# Patient Record
Sex: Female | Born: 1935 | Race: Black or African American | Hispanic: No | Smoking: Former smoker
Health system: Southern US, Community
[De-identification: ages and names within clinical notes are randomized; demographics above are authoritative.]

## PROBLEM LIST (undated history)

## (undated) DIAGNOSIS — E785 Hyperlipidemia, unspecified: Secondary | ICD-10-CM

## (undated) DIAGNOSIS — E669 Obesity, unspecified: Secondary | ICD-10-CM

## (undated) DIAGNOSIS — K219 Gastro-esophageal reflux disease without esophagitis: Secondary | ICD-10-CM

## (undated) DIAGNOSIS — E559 Vitamin D deficiency, unspecified: Secondary | ICD-10-CM

## (undated) DIAGNOSIS — N183 Chronic kidney disease, stage 3 unspecified: Secondary | ICD-10-CM

## (undated) DIAGNOSIS — L309 Dermatitis, unspecified: Secondary | ICD-10-CM

## (undated) DIAGNOSIS — E119 Type 2 diabetes mellitus without complications: Secondary | ICD-10-CM

## (undated) DIAGNOSIS — I10 Essential (primary) hypertension: Secondary | ICD-10-CM

## (undated) DIAGNOSIS — M1990 Unspecified osteoarthritis, unspecified site: Secondary | ICD-10-CM

## (undated) DIAGNOSIS — T7840XA Allergy, unspecified, initial encounter: Secondary | ICD-10-CM

## (undated) HISTORY — DX: Chronic kidney disease, stage 3 unspecified: N18.30

## (undated) HISTORY — DX: Hyperlipidemia, unspecified: E78.5

## (undated) HISTORY — DX: Gastro-esophageal reflux disease without esophagitis: K21.9

## (undated) HISTORY — DX: Dermatitis, unspecified: L30.9

## (undated) HISTORY — DX: Obesity, unspecified: E66.9

## (undated) HISTORY — DX: Chronic kidney disease, stage 3 (moderate): N18.3

## (undated) HISTORY — DX: Allergy, unspecified, initial encounter: T78.40XA

## (undated) HISTORY — DX: Unspecified osteoarthritis, unspecified site: M19.90

## (undated) HISTORY — DX: Vitamin D deficiency, unspecified: E55.9

## (undated) HISTORY — DX: Essential (primary) hypertension: I10

---

## 2007-04-28 DIAGNOSIS — K219 Gastro-esophageal reflux disease without esophagitis: Secondary | ICD-10-CM | POA: Insufficient documentation

## 2008-09-11 ENCOUNTER — Emergency Department: Payer: Self-pay | Admitting: Emergency Medicine

## 2008-09-15 ENCOUNTER — Emergency Department: Payer: Self-pay | Admitting: Emergency Medicine

## 2011-12-31 DIAGNOSIS — E1129 Type 2 diabetes mellitus with other diabetic kidney complication: Secondary | ICD-10-CM | POA: Diagnosis not present

## 2011-12-31 DIAGNOSIS — E785 Hyperlipidemia, unspecified: Secondary | ICD-10-CM | POA: Diagnosis not present

## 2011-12-31 DIAGNOSIS — E559 Vitamin D deficiency, unspecified: Secondary | ICD-10-CM | POA: Diagnosis not present

## 2011-12-31 DIAGNOSIS — N183 Chronic kidney disease, stage 3 unspecified: Secondary | ICD-10-CM | POA: Diagnosis not present

## 2011-12-31 DIAGNOSIS — I10 Essential (primary) hypertension: Secondary | ICD-10-CM | POA: Diagnosis not present

## 2012-05-01 DIAGNOSIS — I10 Essential (primary) hypertension: Secondary | ICD-10-CM | POA: Diagnosis not present

## 2012-05-01 DIAGNOSIS — E1129 Type 2 diabetes mellitus with other diabetic kidney complication: Secondary | ICD-10-CM | POA: Diagnosis not present

## 2012-05-01 DIAGNOSIS — E785 Hyperlipidemia, unspecified: Secondary | ICD-10-CM | POA: Diagnosis not present

## 2012-09-02 DIAGNOSIS — E1129 Type 2 diabetes mellitus with other diabetic kidney complication: Secondary | ICD-10-CM | POA: Diagnosis not present

## 2012-09-02 DIAGNOSIS — I10 Essential (primary) hypertension: Secondary | ICD-10-CM | POA: Diagnosis not present

## 2012-09-02 DIAGNOSIS — E785 Hyperlipidemia, unspecified: Secondary | ICD-10-CM | POA: Diagnosis not present

## 2012-09-02 DIAGNOSIS — Z23 Encounter for immunization: Secondary | ICD-10-CM | POA: Diagnosis not present

## 2013-01-02 DIAGNOSIS — E785 Hyperlipidemia, unspecified: Secondary | ICD-10-CM | POA: Diagnosis not present

## 2013-01-02 DIAGNOSIS — I10 Essential (primary) hypertension: Secondary | ICD-10-CM | POA: Diagnosis not present

## 2013-01-02 DIAGNOSIS — E1129 Type 2 diabetes mellitus with other diabetic kidney complication: Secondary | ICD-10-CM | POA: Diagnosis not present

## 2013-05-04 DIAGNOSIS — E785 Hyperlipidemia, unspecified: Secondary | ICD-10-CM | POA: Diagnosis not present

## 2013-05-04 DIAGNOSIS — I10 Essential (primary) hypertension: Secondary | ICD-10-CM | POA: Diagnosis not present

## 2013-05-04 DIAGNOSIS — E1129 Type 2 diabetes mellitus with other diabetic kidney complication: Secondary | ICD-10-CM | POA: Diagnosis not present

## 2013-09-07 DIAGNOSIS — E1129 Type 2 diabetes mellitus with other diabetic kidney complication: Secondary | ICD-10-CM | POA: Diagnosis not present

## 2013-09-07 DIAGNOSIS — Z23 Encounter for immunization: Secondary | ICD-10-CM | POA: Diagnosis not present

## 2013-09-07 DIAGNOSIS — E785 Hyperlipidemia, unspecified: Secondary | ICD-10-CM | POA: Diagnosis not present

## 2013-10-15 ENCOUNTER — Emergency Department: Payer: Self-pay | Admitting: Emergency Medicine

## 2013-10-15 DIAGNOSIS — E119 Type 2 diabetes mellitus without complications: Secondary | ICD-10-CM | POA: Diagnosis not present

## 2013-10-15 DIAGNOSIS — R259 Unspecified abnormal involuntary movements: Secondary | ICD-10-CM | POA: Diagnosis not present

## 2013-10-15 DIAGNOSIS — I10 Essential (primary) hypertension: Secondary | ICD-10-CM | POA: Diagnosis not present

## 2013-10-15 DIAGNOSIS — R42 Dizziness and giddiness: Secondary | ICD-10-CM | POA: Diagnosis not present

## 2013-10-15 DIAGNOSIS — Z0000 Encounter for general adult medical examination without abnormal findings: Secondary | ICD-10-CM | POA: Diagnosis not present

## 2013-10-15 DIAGNOSIS — Z79899 Other long term (current) drug therapy: Secondary | ICD-10-CM | POA: Diagnosis not present

## 2013-10-15 DIAGNOSIS — R51 Headache: Secondary | ICD-10-CM | POA: Diagnosis not present

## 2013-10-15 LAB — CBC WITH DIFFERENTIAL/PLATELET
Basophil #: 0 10*3/uL (ref 0.0–0.1)
Basophil %: 1 %
Eosinophil %: 3.2 %
HCT: 34.9 % — ABNORMAL LOW (ref 35.0–47.0)
Lymphocyte %: 26.8 %
MCH: 27.9 pg (ref 26.0–34.0)
MCHC: 33.2 g/dL (ref 32.0–36.0)
Monocyte #: 0.4 x10 3/mm (ref 0.2–0.9)
Monocyte %: 10.3 %
Platelet: 272 10*3/uL (ref 150–440)
RBC: 4.16 10*6/uL (ref 3.80–5.20)
WBC: 4.1 10*3/uL (ref 3.6–11.0)

## 2013-10-15 LAB — BASIC METABOLIC PANEL
Chloride: 99 mmol/L (ref 98–107)
Co2: 29 mmol/L (ref 21–32)
EGFR (African American): 48 — ABNORMAL LOW
Potassium: 3.5 mmol/L (ref 3.5–5.1)
Sodium: 134 mmol/L — ABNORMAL LOW (ref 136–145)

## 2013-10-16 LAB — URINALYSIS, COMPLETE
Blood: NEGATIVE
Glucose,UR: NEGATIVE mg/dL (ref 0–75)
Ketone: NEGATIVE
Leukocyte Esterase: NEGATIVE
Nitrite: NEGATIVE
Specific Gravity: 1.004 (ref 1.003–1.030)
Squamous Epithelial: 1

## 2013-11-17 DIAGNOSIS — E1129 Type 2 diabetes mellitus with other diabetic kidney complication: Secondary | ICD-10-CM | POA: Diagnosis not present

## 2013-12-24 DIAGNOSIS — I10 Essential (primary) hypertension: Secondary | ICD-10-CM | POA: Diagnosis not present

## 2013-12-24 DIAGNOSIS — N183 Chronic kidney disease, stage 3 unspecified: Secondary | ICD-10-CM | POA: Diagnosis not present

## 2013-12-24 DIAGNOSIS — E1129 Type 2 diabetes mellitus with other diabetic kidney complication: Secondary | ICD-10-CM | POA: Diagnosis not present

## 2014-01-08 DIAGNOSIS — E1129 Type 2 diabetes mellitus with other diabetic kidney complication: Secondary | ICD-10-CM | POA: Diagnosis not present

## 2014-01-08 DIAGNOSIS — N183 Chronic kidney disease, stage 3 unspecified: Secondary | ICD-10-CM | POA: Diagnosis not present

## 2014-01-08 DIAGNOSIS — E785 Hyperlipidemia, unspecified: Secondary | ICD-10-CM | POA: Diagnosis not present

## 2014-01-08 DIAGNOSIS — I10 Essential (primary) hypertension: Secondary | ICD-10-CM | POA: Diagnosis not present

## 2014-05-24 DIAGNOSIS — E1129 Type 2 diabetes mellitus with other diabetic kidney complication: Secondary | ICD-10-CM | POA: Diagnosis not present

## 2014-05-24 DIAGNOSIS — N183 Chronic kidney disease, stage 3 unspecified: Secondary | ICD-10-CM | POA: Diagnosis not present

## 2014-05-24 DIAGNOSIS — I10 Essential (primary) hypertension: Secondary | ICD-10-CM | POA: Diagnosis not present

## 2014-05-24 DIAGNOSIS — E785 Hyperlipidemia, unspecified: Secondary | ICD-10-CM | POA: Diagnosis not present

## 2014-08-26 DIAGNOSIS — Z23 Encounter for immunization: Secondary | ICD-10-CM | POA: Diagnosis not present

## 2014-09-13 DIAGNOSIS — H25013 Cortical age-related cataract, bilateral: Secondary | ICD-10-CM | POA: Diagnosis not present

## 2014-09-20 DIAGNOSIS — H2512 Age-related nuclear cataract, left eye: Secondary | ICD-10-CM | POA: Diagnosis not present

## 2014-11-03 DIAGNOSIS — H2512 Age-related nuclear cataract, left eye: Secondary | ICD-10-CM | POA: Diagnosis not present

## 2014-11-04 ENCOUNTER — Ambulatory Visit: Payer: Self-pay | Admitting: Ophthalmology

## 2014-11-04 DIAGNOSIS — Z01810 Encounter for preprocedural cardiovascular examination: Secondary | ICD-10-CM | POA: Diagnosis not present

## 2014-11-04 DIAGNOSIS — H2512 Age-related nuclear cataract, left eye: Secondary | ICD-10-CM | POA: Diagnosis not present

## 2014-11-04 DIAGNOSIS — Z01812 Encounter for preprocedural laboratory examination: Secondary | ICD-10-CM | POA: Diagnosis not present

## 2014-11-04 DIAGNOSIS — I10 Essential (primary) hypertension: Secondary | ICD-10-CM | POA: Diagnosis not present

## 2014-11-04 LAB — POTASSIUM: Potassium: 4 mmol/L (ref 3.5–5.1)

## 2014-11-16 ENCOUNTER — Ambulatory Visit: Payer: Self-pay | Admitting: Ophthalmology

## 2014-11-16 DIAGNOSIS — H2512 Age-related nuclear cataract, left eye: Secondary | ICD-10-CM | POA: Diagnosis not present

## 2014-11-16 DIAGNOSIS — E119 Type 2 diabetes mellitus without complications: Secondary | ICD-10-CM | POA: Diagnosis not present

## 2014-11-16 DIAGNOSIS — E669 Obesity, unspecified: Secondary | ICD-10-CM | POA: Diagnosis not present

## 2014-11-16 DIAGNOSIS — M1990 Unspecified osteoarthritis, unspecified site: Secondary | ICD-10-CM | POA: Diagnosis not present

## 2014-11-16 DIAGNOSIS — K219 Gastro-esophageal reflux disease without esophagitis: Secondary | ICD-10-CM | POA: Diagnosis not present

## 2014-11-16 DIAGNOSIS — I10 Essential (primary) hypertension: Secondary | ICD-10-CM | POA: Diagnosis not present

## 2014-11-16 HISTORY — PX: CATARACT EXTRACTION: SUR2

## 2014-11-23 DIAGNOSIS — I10 Essential (primary) hypertension: Secondary | ICD-10-CM | POA: Diagnosis not present

## 2014-11-23 DIAGNOSIS — E1122 Type 2 diabetes mellitus with diabetic chronic kidney disease: Secondary | ICD-10-CM | POA: Diagnosis not present

## 2014-11-23 DIAGNOSIS — E785 Hyperlipidemia, unspecified: Secondary | ICD-10-CM | POA: Diagnosis not present

## 2014-11-23 DIAGNOSIS — N183 Chronic kidney disease, stage 3 (moderate): Secondary | ICD-10-CM | POA: Diagnosis not present

## 2014-11-23 LAB — LIPID PANEL
Cholesterol: 196 mg/dL (ref 0–200)
HDL: 68 mg/dL (ref 35–70)
LDL CALC: 110 mg/dL
TRIGLYCERIDES: 90 mg/dL (ref 40–160)

## 2014-11-23 LAB — HEMOGLOBIN A1C: HEMOGLOBIN A1C: 6.8 % — AB (ref 4.0–6.0)

## 2014-12-24 DIAGNOSIS — Z961 Presence of intraocular lens: Secondary | ICD-10-CM | POA: Diagnosis not present

## 2015-03-19 NOTE — Op Note (Signed)
PATIENT NAME:  Olivia Chambers, Olivia MR#:  [id number] DATE OF BIRTH:  1928/11/17  DATE OF PROCEDURE:  11/16/2014  PREOPERATIVE DIAGNOSIS:  Nuclear sclerotic cataract of the left eye.   POSTOPERATIVE DIAGNOSIS:  Nuclear sclerotic cataract of the left eye.   OPERATIVE PROCEDURE:  Cataract extraction by phacoemulsification with implant of intraocular lens to left eye.   SURGEON:  Jerilee FieldWilliam L. Kinza Gouveia, MD  ANESTHESIA:  1. Managed anesthesia care.  2. Topical tetracaine drops followed by 2% Xylocaine jelly applied in the preoperative holding area.   COMPLICATIONS:  None.   TECHNIQUE:   Stop and chop.    DESCRIPTION OF PROCEDURE:  The patient was examined and consented in the preoperative holding area where the aforementioned topical anesthesia was applied to the left eye and then brought back to the Operating Room where the left eye was prepped and draped in the usual sterile ophthalmic fashion and a lid speculum was placed. A paracentesis was created with the side port blade and the anterior chamber was filled with viscoelastic. A near clear corneal incision was performed with the steel keratome. A continuous curvilinear capsulorrhexis was performed with a cystotome followed by the capsulorrhexis forceps. Hydrodissection and hydrodelineation were carried out with BSS on a blunt cannula. The lens was removed in a stop and chop technique and the remaining cortical material was removed with the irrigation-aspiration handpiece. The capsular bag was inflated with viscoelastic and the Tecnis ZCB00 22.5-diopter lens, serial number [id number], was placed in the capsular bag without complication. The remaining viscoelastic was removed from the eye with the irrigation-aspiration handpiece. The wounds were hydrated. The anterior chamber was flushed with Miostat and the eye was inflated to physiologic pressure. 0.1 mL of cefuroxime concentration 10 mg/mL was placed in the anterior chamber. The wounds were found to be  water tight. The eye was dressed with Vigamox. The patient was given protective glasses to wear throughout the day and a shield with which to sleep tonight. The patient was also given drops with which to begin a drop regimen today and will follow-up with me in one day.     ____________________________ Jerilee FieldWilliam L. Jasson Siegmann, MD wlp:nb D: 11/16/2014 21:54:30 ET T: 11/16/2014 22:07:04 ET JOB#: [id number]  cc: Page Pucciarelli L. Milburn Freeney, MD, <Dictator> Jerilee FieldWILLIAM L Corey Laski MD ELECTRONICALLY SIGNED 11/17/2014 14:41

## 2015-05-22 ENCOUNTER — Encounter: Payer: Self-pay | Admitting: Family Medicine

## 2015-05-22 DIAGNOSIS — IMO0002 Reserved for concepts with insufficient information to code with codable children: Secondary | ICD-10-CM | POA: Insufficient documentation

## 2015-05-22 DIAGNOSIS — E1129 Type 2 diabetes mellitus with other diabetic kidney complication: Secondary | ICD-10-CM | POA: Insufficient documentation

## 2015-05-22 DIAGNOSIS — E1169 Type 2 diabetes mellitus with other specified complication: Secondary | ICD-10-CM

## 2015-05-22 DIAGNOSIS — N183 Chronic kidney disease, stage 3 unspecified: Secondary | ICD-10-CM | POA: Insufficient documentation

## 2015-05-22 DIAGNOSIS — E559 Vitamin D deficiency, unspecified: Secondary | ICD-10-CM | POA: Insufficient documentation

## 2015-05-22 DIAGNOSIS — I10 Essential (primary) hypertension: Secondary | ICD-10-CM | POA: Insufficient documentation

## 2015-05-22 DIAGNOSIS — J309 Allergic rhinitis, unspecified: Secondary | ICD-10-CM | POA: Insufficient documentation

## 2015-05-22 DIAGNOSIS — E785 Hyperlipidemia, unspecified: Secondary | ICD-10-CM | POA: Insufficient documentation

## 2015-05-22 DIAGNOSIS — M1710 Unilateral primary osteoarthritis, unspecified knee: Secondary | ICD-10-CM | POA: Insufficient documentation

## 2015-05-22 DIAGNOSIS — E1159 Type 2 diabetes mellitus with other circulatory complications: Secondary | ICD-10-CM

## 2015-05-22 DIAGNOSIS — E669 Obesity, unspecified: Secondary | ICD-10-CM | POA: Insufficient documentation

## 2015-05-24 ENCOUNTER — Encounter: Payer: Self-pay | Admitting: Family Medicine

## 2015-05-24 ENCOUNTER — Ambulatory Visit (INDEPENDENT_AMBULATORY_CARE_PROVIDER_SITE_OTHER): Payer: Commercial Managed Care - HMO | Admitting: Family Medicine

## 2015-05-24 VITALS — BP 118/66 | HR 89 | Temp 97.7°F | Resp 16 | Ht 64.0 in | Wt 227.6 lb

## 2015-05-24 DIAGNOSIS — N183 Chronic kidney disease, stage 3 unspecified: Secondary | ICD-10-CM

## 2015-05-24 DIAGNOSIS — R011 Cardiac murmur, unspecified: Secondary | ICD-10-CM | POA: Diagnosis not present

## 2015-05-24 DIAGNOSIS — E785 Hyperlipidemia, unspecified: Secondary | ICD-10-CM

## 2015-05-24 DIAGNOSIS — E1122 Type 2 diabetes mellitus with diabetic chronic kidney disease: Secondary | ICD-10-CM

## 2015-05-24 DIAGNOSIS — E1322 Other specified diabetes mellitus with diabetic chronic kidney disease: Secondary | ICD-10-CM

## 2015-05-24 DIAGNOSIS — I10 Essential (primary) hypertension: Secondary | ICD-10-CM

## 2015-05-24 DIAGNOSIS — E669 Obesity, unspecified: Secondary | ICD-10-CM

## 2015-05-24 LAB — POCT UA - MICROALBUMIN: MICROALBUMIN (UR) POC: NEGATIVE mg/L

## 2015-05-24 LAB — POCT GLYCOSYLATED HEMOGLOBIN (HGB A1C): Hemoglobin A1C: 7.3

## 2015-05-24 MED ORDER — ROSUVASTATIN CALCIUM 40 MG PO TABS: 40.0000 mg | ORAL_TABLET | Freq: Every day | ORAL | Status: DC

## 2015-05-24 MED ORDER — LISINOPRIL 40 MG PO TABS: 40.0000 mg | ORAL_TABLET | Freq: Every evening | ORAL | Status: DC

## 2015-05-24 MED ORDER — GLIPIZIDE ER 2.5 MG PO TB24: 2.5000 mg | ORAL_TABLET | Freq: Every day | ORAL | Status: DC

## 2015-05-24 MED ORDER — GLUCOSE BLOOD VI STRP: 1.0000 | ORAL_STRIP | Freq: Two times a day (BID) | Status: DC

## 2015-05-24 MED ORDER — METFORMIN HCL 500 MG PO TABS: 500.0000 mg | ORAL_TABLET | Freq: Two times a day (BID) | ORAL | Status: DC

## 2015-05-24 MED ORDER — AMLODIPINE BESYLATE 5 MG PO TABS: 5.0000 mg | ORAL_TABLET | Freq: Every day | ORAL | Status: DC

## 2015-05-24 MED ORDER — HYDROCHLOROTHIAZIDE 25 MG PO TABS: 25.0000 mg | ORAL_TABLET | Freq: Every day | ORAL | Status: DC

## 2015-05-24 NOTE — Patient Instructions (Signed)
Heart Murmur A heart murmur is an extra sound heard by your health care provider when listening to your heart with a device called a stethoscope. The sound comes from turbulence when blood flows through the heart and may be a "hum" or "whoosh" sound heard when the heart beats. There are two types of heart murmurs:  Innocent murmurs. Most people with this type of heart murmur do not have a heart problem. Many children have innocent heart murmurs. Your health care provider may suggest some basic testing to know whether your murmur is an innocent murmur. If an innocent heart murmur is found, there is no need for further tests or treatment and no need to restrict activities or stop playing sports.  Abnormal murmurs. These types of murmurs can occur in children and adults. In children, abnormal heart murmurs are typically caused from heart defects that are present at birth (congenital). In adults, abnormal murmurs are usually from heart valve problems caused by disease, infection, or aging. CAUSES  All heart murmurs are a result of an issue with your heart valves. Normally, these valves open to let blood flow through or out of your heart and then shut to keep it from flowing backward. If they do not work properly, you could have:  Regurgitation--When blood leaks back through the valve in the wrong direction.  Mitral valve prolapse--When the mitral valve of the heart has a loose flap and does not close tightly.  Stenosis--When the valve does not open enough and blocks blood flow. SIGNS AND SYMPTOMS  Innocent murmurs do not cause symptoms, and many people with abnormal murmurs may or may not have symptoms. If symptoms do develop, they may include:  Shortness of breath.  Blue coloring of the skin, especially on the fingertips.  Chest pain.  Palpitations, or feeling a fluttering or skipped heartbeat.  Fainting.  Persistent cough.  Getting tired much faster than expected. DIAGNOSIS  A heart  murmur might be heard during a sports physical or during any type of examination. When a murmur is heard, it may suggest a possible problem. When this happens, your health care provider may ask you to see a heart specialist (cardiologist). You may also be asked to have one or more heart tests. In these cases, testing may vary depending on what your health care provider heard. Tests for a heart murmur may include:  Electrocardiogram.  Echocardiogram.  MRI. For children and adults who have an abnormal heart murmur and want to play sports, it is important to complete testing, review test results, and receive recommendations from your health care provider. If heart disease is present, it may not be safe to play. TREATMENT  Innocent murmurs require no treatment or activity restriction. If an abnormal murmur represents a problem with the heart, treatment will depend on the exact nature of the problem. In these cases, medicine or surgery may be needed to treat the problem. HOME CARE INSTRUCTIONS If you want to participate in sports or other types of strenuous physical activity, it is important to discuss this first with your health care provider. If the murmur represents a problem with the heart and you choose to participate in sports, there is a small chance that a serious problem (including sudden death) could result.  SEEK MEDICAL CARE IF:   You feel that your symptoms are slowly worsening.  You develop any new symptoms that cause concern.  You feel that you are having side effects from any medicines prescribed. SEEK IMMEDIATE MEDICAL CARE IF:     You develop chest pain.  You have shortness of breath.  You notice that your heart beats irregularly often enough to cause you to worry.  You have fainting spells.  Your symptoms suddenly get worse. Document Released: 12/20/2004 Document Revised: 11/17/2013 Document Reviewed: 07/20/2013 Exit[employer] Patient Information 2015 [employer]. This  information is not intended to replace advice given to you by your health care provider. Make sure you discuss any questions you have with your health care provider.  

## 2015-05-24 NOTE — Progress Notes (Signed)
Name: Olivia RubensteinObelia L Chambers   MRN: [id number]    DOB: 12/18/1927   Date:05/24/2015       Progress Note  Subjective  Chief Complaint  Chief Complaint  Patient presents with  . Medication Refill    6 month F/U  . Hypertension  . Diabetes    Checks BS 2x daily Low-56 Average-98 High-200  . Hyperlipidemia  . Chronic Kidney Disease    HPI  DMII with renal manifestation:  Olivia Chambers is a 81 year old pleasant female, that came in today for follow up.  She checks her fsbs twice daily and she had one episode of hypoglycemia about 3 months, because she skipped a meal. She is compliant with her medications, hgbA1C is up to 7.3% , but because of her age that is still considered at goal.  She has CKI stage 3 and is taking lisinopril daily.   HTN: bp is towards the low end of normal, she denies orthostatic changes, dizziness or syncope, no chest pain or palpitation.  She is compliant with her medications  Hyperlipidemia: taking Crestor daily and LDL was 100 and HDL 68 in Dec 2015, denies side effects of medications  Obesity: She has gained a couple of pounds since last visit, states not as compliant with her diet lately, but will resume it now.   Patient Active Problem List   Diagnosis Date Noted  . Benign essential HTN 05/22/2015  . Chronic kidney disease (CKD), stage III (moderate) 05/22/2015  . Dyslipidemia 05/22/2015  . Obesity (BMI 30-39.9) 05/22/2015  . Osteoarthrosis, unspecified whether generalized or localized, involving lower leg 05/22/2015  . Allergic rhinitis 05/22/2015  . Diabetes mellitus with renal manifestation 05/22/2015  . Vitamin D deficiency 05/22/2015  . Gastro-esophageal reflux disease without esophagitis 04/28/2007    Past Surgical History  Procedure Laterality Date  . Cataract extraction Left 11/16/2014    Right- 12-04-2014    Family History  Problem Relation Age of Onset  . Hypertension Mother   . Diabetes Mother   . Cancer Father     Prostate  . Hypertension  Father   . Diabetes Sister   . Dementia Sister   . Diabetes Brother     History   Social History  . Marital Status: Widowed    Spouse Name: N/A  . Number of Children: N/A  . Years of Education: N/A   Occupational History  . Not on file.   Social History Main Topics  . Smoking status: Former Smoker -- 0.50 packs/day    Types: Cigarettes    Quit date: 11/26/2002  . Smokeless tobacco: Never Used  . Alcohol Use: No  . Drug Use: No  . Sexual Activity: No   Other Topics Concern  . Not on file   Social History Narrative     Current outpatient prescriptions:  .  ACCU-CHEK FASTCLIX LANCETS MISC, , Disp: , Rfl:  .  Alcohol Swabs (B-D SINGLE USE SWABS REGULAR) PADS, , Disp: , Rfl:  .  amLODipine (NORVASC) 5 MG tablet, Take 1 tablet (5 mg total) by mouth daily., Disp: 90 tablet, Rfl: 1 .  aspirin 81 MG chewable tablet, Chew 1 tablet by mouth daily., Disp: , Rfl:  .  Cholecalciferol (VITAMIN D) 2000 UNITS tablet, Take 1 tablet by mouth daily., Disp: , Rfl:  .  glipiZIDE (GLUCOTROL XL) 2.5 MG 24 hr tablet, Take 1 tablet (2.5 mg total) by mouth daily., Disp: 90 tablet, Rfl: 1 .  glucose blood (ACCU-CHEK COMPACT PLUS) test strip,  1 each by Other route 2 (two) times daily. Use as instructed, Disp: 100 each, Rfl: 1 .  hydrochlorothiazide (HYDRODIURIL) 25 MG tablet, Take 1 tablet (25 mg total) by mouth daily., Disp: 90 tablet, Rfl: 3 .  lisinopril (PRINIVIL,ZESTRIL) 40 MG tablet, Take 1 tablet (40 mg total) by mouth every evening., Disp: 90 tablet, Rfl: 3 .  metFORMIN (GLUCOPHAGE) 500 MG tablet, Take 1 tablet (500 mg total) by mouth 2 (two) times daily., Disp: 180 tablet, Rfl: 3 .  rosuvastatin (CRESTOR) 40 MG tablet, Take 1 tablet (40 mg total) by mouth daily., Disp: 90 tablet, Rfl: 3  Allergies  Allergen Reactions  . Banana Anaphylaxis  . Pioglitazone     pt states causes headaches  . Sitagliptin Other (See Comments)  . Shrimp [Shellfish Allergy] Hives and Rash      ROS  Constitutional: Negative for fever or significant weight change.  Respiratory: Negative for cough and shortness of breath.   Cardiovascular: Negative for chest pain or palpitations.  Gastrointestinal: Negative for abdominal pain, no bowel changes.  Musculoskeletal: Negative for gait problem or joint swelling.  Skin: Negative for rash.  Neurological: Negative for dizziness or headache.  No other specific complaints in a complete review of systems (except as listed in HPI above).   Objective  Filed Vitals:   05/24/15 0820  BP: 118/66  Pulse: 89  Temp: 97.7 F (36.5 C)  TempSrc: Oral  Resp: 16  Height: 5\' 4"  (1.626 m)  Weight: 227 lb 9.6 oz (103.239 kg)  SpO2: 98%    Body mass index is 39.05 kg/(m^2).  Physical Exam Constitutional: Patient appears well-developed and well-nourished. No distress.  HENT: Head: Normocephalic and atraumatic. Nose: Nose normal. Mouth/Throat: Oropharynx is clear and moist. No oropharyngeal exudate.  Eyes: Conjunctivae and EOM are normal. Pupils are equal, round, and reactive to light. No scleral icterus.  Neck: Normal range of motion. Neck supple. No JVD present. No thyromegaly present.  Cardiovascular: Normal rate, regular rhythm and normal heart sounds. Heart murmur on 2nd intercostal space right border 2/6 , with an opening click. trace pretibial edema Pulmonary/Chest: Effort normal and breath sounds normal. No respiratory distress. Abdominal: Soft. Bowel sounds are normal, no distension. There is no tenderness. no masses Musculoskeletal: Normal range of motion, no joint effusions. No gross deformities Neurological: he is alert and oriented to person, place, and time. No cranial nerve deficit. Coordination, balance, strength, speech and gait are normal.  Skin: Skin is warm and dry. No rash noted. No erythema.  Psychiatric: Patient has a normal mood and affect. behavior is normal. Judgment and thought content normal.  Recent Results  (from the past 2160 hour(s))  POCT UA - Microalbumin     Status: None   Collection Time: 05/24/15  8:18 AM  Result Value Ref Range   Microalbumin Ur, POC negative mg/L   Creatinine, POC  mg/dL   Albumin/Creatinine Ratio, Urine, POC    POCT HgB A1C     Status: Abnormal   Collection Time: 05/24/15  8:29 AM  Result Value Ref Range   Hemoglobin A1C 7.3     Diabetic Foot Exam: Diabetic Foot Exam - Simple   Simple Foot Form  Visual Inspection  See comments:  Yes  Sensation Testing  Intact to touch and monofilament testing bilaterally:  Yes  Pulse Check  Posterior Tibialis and Dorsalis pulse intact bilaterally:  Yes  Comments  Corn formation , thick nails and hammer toes       PHQ2/9: Depression  screen PHQ 2/9 05/24/2015  Decreased Interest 0  Down, Depressed, Hopeless 0  PHQ - 2 Score 0     Fall Risk: Fall Risk  05/24/2015  Falls in the past year? No    Assessment & Plan  1. Diabetes mellitus with stage 3 chronic kidney disease She does not want to see a podiatrist at this time, urine micro normal  - POCT HgB A1C - POCT UA - Microalbumin - metFORMIN (GLUCOPHAGE) 500 MG tablet; Take 1 tablet (500 mg total) by mouth 2 (two) times daily.  Dispense: 180 tablet; Refill: 3 - glipiZIDE (GLUCOTROL XL) 2.5 MG 24 hr tablet; Take 1 tablet (2.5 mg total) by mouth daily.  Dispense: 90 tablet; Refill: 1 - glucose blood (ACCU-CHEK COMPACT PLUS) test strip; 1 each by Other route 2 (two) times daily. Use as instructed  Dispense: 100 each; Refill: 1  2. Dyslipidemia Continue medication - rosuvastatin (CRESTOR) 40 MG tablet; Take 1 tablet (40 mg total) by mouth daily.  Dispense: 90 tablet; Refill: 3  3. Chronic kidney disease (CKD), stage III (moderate) We will decrease lisinopril to once daily since bp is towards low end of normal  - lisinopril (PRINIVIL,ZESTRIL) 40 MG tablet; Take 1 tablet (40 mg total) by mouth every evening.  Dispense: 90 tablet; Refill: 3  4. Benign essential  HTN Adjust medication, we may need to decrease norvasc on her next visit, she was hesitant of too many changes today, decreased dose of lisinopril to once daily  - hydrochlorothiazide (HYDRODIURIL) 25 MG tablet; Take 1 tablet (25 mg total) by mouth daily.  Dispense: 90 tablet; Refill: 3 - lisinopril (PRINIVIL,ZESTRIL) 40 MG tablet; Take 1 tablet (40 mg total) by mouth every evening.  Dispense: 90 tablet; Refill: 3 - amLODipine (NORVASC) 5 MG tablet; Take 1 tablet (5 mg total) by mouth daily.  Dispense: 90 tablet; Refill: 1  5. Obesity (BMI 30-39.9) Discussed importance of exercise   6. Heart murmur, systolic Offered to order an Echo but she wants to hold off, explained symptoms  : chest pain, palpitation, SOB, syncope to call us back.

## 2015-07-04 DIAGNOSIS — E11319 Type 2 diabetes mellitus with unspecified diabetic retinopathy without macular edema: Secondary | ICD-10-CM | POA: Diagnosis not present

## 2015-07-05 ENCOUNTER — Encounter: Payer: Self-pay | Admitting: Family Medicine

## 2015-07-05 DIAGNOSIS — H3500 Unspecified background retinopathy: Secondary | ICD-10-CM | POA: Insufficient documentation

## 2015-08-15 ENCOUNTER — Encounter: Payer: Self-pay | Admitting: Family Medicine

## 2015-08-15 ENCOUNTER — Ambulatory Visit (INDEPENDENT_AMBULATORY_CARE_PROVIDER_SITE_OTHER): Payer: Commercial Managed Care - HMO | Admitting: Family Medicine

## 2015-08-15 VITALS — BP 124/64 | HR 95 | Temp 98.0°F | Resp 18 | Ht 64.0 in | Wt 228.2 lb

## 2015-08-15 DIAGNOSIS — N183 Chronic kidney disease, stage 3 unspecified: Secondary | ICD-10-CM

## 2015-08-15 DIAGNOSIS — I10 Essential (primary) hypertension: Secondary | ICD-10-CM | POA: Diagnosis not present

## 2015-08-15 DIAGNOSIS — Z23 Encounter for immunization: Secondary | ICD-10-CM | POA: Diagnosis not present

## 2015-08-15 NOTE — Progress Notes (Signed)
Name: Olivia Chambers   MRN: [id number]    DOB: October 08, 1928   Date:08/15/2015       Progress Note  Subjective  Chief Complaint  Chief Complaint  Patient presents with  . Hypertension    Patient states blood pressure keeps increasing and decreasing.  She is concerned    HPI  HTN: she came in today for medication review, she states her blood pressure is usually at goal . She states she has been taking all of her bp medications in am, and sometimes in the afternoon bp is going up to 146/70's and she has been concerned about it. She denies chest pain, SOB or palpitation.     Patient Active Problem List   Diagnosis Date Noted  . Retinopathy, background, nonproliferative, mild 07/05/2015  . Benign essential HTN 05/22/2015  . Chronic kidney disease (CKD), stage III (moderate) 05/22/2015  . Dyslipidemia 05/22/2015  . Obesity (BMI 30-39.9) 05/22/2015  . Osteoarthrosis, unspecified whether generalized or localized, involving lower leg 05/22/2015  . Allergic rhinitis 05/22/2015  . Diabetes mellitus with renal manifestation 05/22/2015  . Vitamin D deficiency 05/22/2015  . Gastro-esophageal reflux disease without esophagitis 04/28/2007    Past Surgical History  Procedure Laterality Date  . Cataract extraction Left 11/16/2014    Right- 12-04-2014    Family History  Problem Relation Age of Onset  . Hypertension Mother   . Diabetes Mother   . Cancer Father     Prostate  . Hypertension Father   . Diabetes Sister   . Dementia Sister   . Diabetes Brother     Social History   Social History  . Marital Status: Widowed    Spouse Name: N/A  . Number of Children: N/A  . Years of Education: N/A   Occupational History  . Not on file.   Social History Main Topics  . Smoking status: Former Smoker -- 0.50 packs/day    Types: Cigarettes    Quit date: 11/26/2002  . Smokeless tobacco: Never Used  . Alcohol Use: No  . Drug Use: No  . Sexual Activity: No   Other Topics Concern  . Not  on file   Social History Narrative     Current outpatient prescriptions:  .  ACCU-CHEK FASTCLIX LANCETS MISC, , Disp: , Rfl:  .  Alcohol Swabs (B-D SINGLE USE SWABS REGULAR) PADS, , Disp: , Rfl:  .  amLODipine (NORVASC) 5 MG tablet, Take 1 tablet (5 mg total) by mouth daily., Disp: 90 tablet, Rfl: 1 .  aspirin 81 MG chewable tablet, Chew 1 tablet by mouth daily., Disp: , Rfl:  .  Cholecalciferol (VITAMIN D) 2000 UNITS tablet, Take 1 tablet by mouth daily., Disp: , Rfl:  .  glipiZIDE (GLUCOTROL XL) 2.5 MG 24 hr tablet, Take 1 tablet (2.5 mg total) by mouth daily., Disp: 90 tablet, Rfl: 1 .  glucose blood (ACCU-CHEK COMPACT PLUS) test strip, 1 each by Other route 2 (two) times daily. Use as instructed, Disp: 100 each, Rfl: 1 .  hydrochlorothiazide (HYDRODIURIL) 25 MG tablet, Take 1 tablet (25 mg total) by mouth daily., Disp: 90 tablet, Rfl: 3 .  lisinopril (PRINIVIL,ZESTRIL) 40 MG tablet, Take 1 tablet (40 mg total) by mouth every evening., Disp: 90 tablet, Rfl: 3 .  metFORMIN (GLUCOPHAGE) 500 MG tablet, Take 1 tablet (500 mg total) by mouth 2 (two) times daily., Disp: 180 tablet, Rfl: 3 .  rosuvastatin (CRESTOR) 40 MG tablet, Take 1 tablet (40 mg total) by mouth daily., Disp: 90  tablet, Rfl: 3  Allergies  Allergen Reactions  . Banana Anaphylaxis  . Pioglitazone     pt states causes headaches  . Sitagliptin Other (See Comments)  . Shrimp [Shellfish Allergy] Hives and Rash     ROS  Constitutional: Negative for fever or weight change.  Respiratory: Negative for cough and shortness of breath.   Cardiovascular: Negative for chest pain or palpitations.  Gastrointestinal: Negative for abdominal pain, no bowel changes.  Musculoskeletal: Negative for gait problem or joint swelling.  Skin: Negative for rash.  Neurological: Negative for dizziness or headache.  No other specific complaints in a complete review of systems (except as listed in HPI above).  Objective  Filed Vitals:    08/15/15 1602  BP: 144/76  Pulse: 95  Temp: 98 F (36.7 C)  TempSrc: Oral  Resp: 18  Height:  (1.626 m)  Weight: 228 lb 3.2 oz (103.511 kg)  SpO2: 97%    Body mass index is 39.15 kg/(m^2).  Physical Exam  Constitutional: Patient appears well-developed, younger than biological age   Obese  No distress.  HEENT: head atraumatic, normocephalic, pupils equal and reactive to light, neck supple, throat within normal limits Cardiovascular: Normal rate, regular rhythm and normal heart sounds.  No murmur heard. Trace BLE edema. Pulmonary/Chest: Effort normal and breath sounds normal. No respiratory distress. Abdominal: Soft.  There is no tenderness. Psychiatric: Patient has a normal mood and affect. behavior is normal. Judgment and thought content normal.  Recent Results (from the past 2160 hour(s))  POCT UA - Microalbumin     Status: None   Collection Time: 05/24/15  8:18 AM  Result Value Ref Range   Microalbumin Ur, POC negative mg/L   Creatinine, POC  mg/dL   Albumin/Creatinine Ratio, Urine, POC    POCT HgB A1C     Status: Abnormal   Collection Time: 05/24/15  8:29 AM  Result Value Ref Range   Hemoglobin A1C 7.3      PHQ2/9: Depression screen [facility] 2/9 08/15/2015 05/24/2015  Decreased Interest 0 0  Down, Depressed, Hopeless 0 0  PHQ - 2 Score 0 0     Fall Risk: Fall Risk  08/15/2015 05/24/2015  Falls in the past year? No No      Functional Status Survey: Is the patient deaf or have difficulty hearing?: No Does the patient have difficulty seeing, even when wearing glasses/contacts?: Yes (reading glasses) Does the patient have difficulty concentrating, remembering, or making decisions?: No Does the patient have difficulty walking or climbing stairs?: No Does the patient have difficulty dressing or bathing?: No Does the patient have difficulty doing errands alone such as visiting a doctor's office or shopping?: No    Assessment & Plan  1. Benign essential  HTN Gave her reassurance and advised her to take Amlodipine at night, HCTZ and lisinopril in am. BP can go up to 150/90, as long as it is occasionally, for her age having bp around 140/80 is great.   2. Needs flu shot  - Flu vaccine HIGH DOSE PF (Fluzone High dose)  3. Chronic kidney disease (CKD), stage III (moderate) She wants to go up to lisinopril twice daily but advised to only take once a day since she is taking high dose lisinopril already   4. Need for pneumococcal vaccination  - Pneumococcal conjugate vaccine 13-valent IM

## 2015-08-21 ENCOUNTER — Emergency Department
Admission: EM | Admit: 2015-08-21 | Discharge: 2015-08-21 | Disposition: A | Discharge status: Home / Self Care | Payer: Commercial Managed Care - HMO | Attending: Student | Admitting: Student

## 2015-08-21 ENCOUNTER — Encounter: Payer: Self-pay | Admitting: Emergency Medicine

## 2015-08-21 ENCOUNTER — Other Ambulatory Visit: Payer: Self-pay

## 2015-08-21 DIAGNOSIS — Z87891 Personal history of nicotine dependence: Secondary | ICD-10-CM | POA: Diagnosis not present

## 2015-08-21 DIAGNOSIS — F17210 Nicotine dependence, cigarettes, uncomplicated: Secondary | ICD-10-CM | POA: Diagnosis not present

## 2015-08-21 DIAGNOSIS — N183 Chronic kidney disease, stage 3 (moderate): Secondary | ICD-10-CM | POA: Insufficient documentation

## 2015-08-21 DIAGNOSIS — Z7982 Long term (current) use of aspirin: Secondary | ICD-10-CM | POA: Insufficient documentation

## 2015-08-21 DIAGNOSIS — I10 Essential (primary) hypertension: Secondary | ICD-10-CM | POA: Diagnosis not present

## 2015-08-21 DIAGNOSIS — E1122 Type 2 diabetes mellitus with diabetic chronic kidney disease: Secondary | ICD-10-CM | POA: Diagnosis not present

## 2015-08-21 DIAGNOSIS — E119 Type 2 diabetes mellitus without complications: Secondary | ICD-10-CM | POA: Diagnosis not present

## 2015-08-21 DIAGNOSIS — I129 Hypertensive chronic kidney disease with stage 1 through stage 4 chronic kidney disease, or unspecified chronic kidney disease: Secondary | ICD-10-CM | POA: Diagnosis not present

## 2015-08-21 DIAGNOSIS — Z79899 Other long term (current) drug therapy: Secondary | ICD-10-CM | POA: Diagnosis not present

## 2015-08-21 HISTORY — DX: Type 2 diabetes mellitus without complications: E11.9

## 2015-08-21 LAB — BASIC METABOLIC PANEL
ANION GAP: 10 (ref 5–15)
BUN: 15 mg/dL (ref 6–20)
CALCIUM: 9.9 mg/dL (ref 8.9–10.3)
CO2: 29 mmol/L (ref 22–32)
Chloride: 100 mmol/L — ABNORMAL LOW (ref 101–111)
Creatinine, Ser: 1.03 mg/dL — ABNORMAL HIGH (ref 0.44–1.00)
GFR, EST AFRICAN AMERICAN: 56 mL/min — AB (ref >60)
GFR, EST NON AFRICAN AMERICAN: 48 mL/min — AB (ref >60)
GLUCOSE: 123 mg/dL — AB (ref 65–99)
POTASSIUM: 3.7 mmol/L (ref 3.5–5.1)
Sodium: 139 mmol/L (ref 135–145)

## 2015-08-21 LAB — URINALYSIS COMPLETE WITH MICROSCOPIC (ARMC ONLY)
Bilirubin Urine: NEGATIVE
GLUCOSE, UA: NEGATIVE mg/dL
Hgb urine dipstick: NEGATIVE
Leukocytes, UA: NEGATIVE
NITRITE: NEGATIVE
PROTEIN: NEGATIVE mg/dL
SPECIFIC GRAVITY, URINE: 1.011 (ref 1.005–1.030)
pH: 7 (ref 5.0–8.0)

## 2015-08-21 LAB — CBC
HCT: 36.1 % (ref 35.0–47.0)
Hemoglobin: 11.9 g/dL — ABNORMAL LOW (ref 12.0–16.0)
MCH: 28.5 pg (ref 26.0–34.0)
MCHC: 32.9 g/dL (ref 32.0–36.0)
MCV: 86.5 fL (ref 80.0–100.0)
Platelets: 246 10*3/uL (ref 150–440)
RBC: 4.17 MIL/uL (ref 3.80–5.20)
RDW: 14 % (ref 11.5–14.5)
WBC: 3.7 10*3/uL (ref 3.6–11.0)

## 2015-08-21 LAB — GLUCOSE, CAPILLARY: GLUCOSE-CAPILLARY: 117 mg/dL — AB (ref 65–99)

## 2015-08-21 NOTE — ED Notes (Signed)
Pt presents to the ER from home with complaints of feeling dizzy, and weak since Tuesday. Pt reports she received the Flu and Pneumonia Vaccine on Tuesday, pt reports she has been feeling tired ever since then. Pt reports she feels like her BP is high and CBG are up and down. Pt denies any other medical complaint at present. Pt's CBG in triage was 117 and BP 129/61.

## 2015-08-21 NOTE — ED Provider Notes (Signed)
[facility] Emergency Department Provider Note  ____________________________________________  Time seen: Approximately 12:09 PM  I have reviewed the triage vital signs and the nursing notes.   HISTORY  Chief Complaint Hypertension and Diabetes    HPI Olivia Chambers is a 81 y.o. female with history of hypertension, hyperlipidemia, chronic kidney disease, diabetes who presents for evaluation of elevated blood pressure today, feeling "jittery", gradual onset today, now resolved. The patient reports that she had a pneumonia vaccine as well as a flu vaccine last week and since that time she has had some fatigue. Today she left church because she was feeling "jittery" came home and checked her blood pressure and it was "high... Like 140 something". She thought her blood sugar might be low and she checked it and it was in the 100s. Currently she reports she feels well. She denies any recent illness including no cough, sneezing, runny nose, congestion, vomiting, diarrhea, fevers or chills. No headache or vision change. Currently she has no symptoms. There are no modifying factors.   Past Medical History  Diagnosis Date  . Obesity   . GERD (gastroesophageal reflux disease)   . Hypertension   . Hyperlipidemia   . CKD (chronic kidney disease) stage 3, GFR 30-59 ml/min   . Osteoarthritis   . Dermatitis   . Vitamin D deficiency   . Allergy   . Diabetes mellitus without complication     Patient Active Problem List   Diagnosis Date Noted  . Retinopathy, background, nonproliferative, mild 07/05/2015  . Benign essential HTN 05/22/2015  . Chronic kidney disease (CKD), stage III (moderate) 05/22/2015  . Dyslipidemia 05/22/2015  . Obesity (BMI 30-39.9) 05/22/2015  . Osteoarthrosis, unspecified whether generalized or localized, involving lower leg 05/22/2015  . Allergic rhinitis 05/22/2015  . Diabetes mellitus with renal manifestation 05/22/2015  . Vitamin D deficiency  05/22/2015  . Gastro-esophageal reflux disease without esophagitis 04/28/2007    Past Surgical History  Procedure Laterality Date  . Cataract extraction Left 11/16/2014    Right- 12-04-2014    Current Outpatient Rx  Name  Route  Sig  Dispense  Refill  . ACCU-CHEK FASTCLIX LANCETS MISC               . Alcohol Swabs (B-D SINGLE USE SWABS REGULAR) PADS               . amLODipine (NORVASC) 5 MG tablet   Oral   Take 1 tablet (5 mg total) by mouth daily.   90 tablet   1   . aspirin 81 MG chewable tablet   Oral   Chew 1 tablet by mouth daily.         . Cholecalciferol (VITAMIN D) 2000 UNITS tablet   Oral   Take 1 tablet by mouth daily.         Marland Kitchen glipiZIDE (GLUCOTROL XL) 2.5 MG 24 hr tablet   Oral   Take 1 tablet (2.5 mg total) by mouth daily.   90 tablet   1   . glucose blood (ACCU-CHEK COMPACT PLUS) test strip   Other   1 each by Other route 2 (two) times daily. Use as instructed   100 each   1   . hydrochlorothiazide (HYDRODIURIL) 25 MG tablet   Oral   Take 1 tablet (25 mg total) by mouth daily.   90 tablet   3   . lisinopril (PRINIVIL,ZESTRIL) 40 MG tablet   Oral   Take 1 tablet (40 mg total)  by mouth every evening.   90 tablet   3   . metFORMIN (GLUCOPHAGE) 500 MG tablet   Oral   Take 1 tablet (500 mg total) by mouth 2 (two) times daily.   180 tablet   3   . rosuvastatin (CRESTOR) 40 MG tablet   Oral   Take 1 tablet (40 mg total) by mouth daily.   90 tablet   3     Allergies Banana; Pioglitazone; Sitagliptin; and Shrimp  Family History  Problem Relation Age of Onset  . Hypertension Mother   . Diabetes Mother   . Cancer Father     Prostate  . Hypertension Father   . Diabetes Sister   . Dementia Sister   . Diabetes Brother     Social History Social History  Substance Use Topics  . Smoking status: Former Smoker -- 0.50 packs/day    Types: Cigarettes    Quit date: 11/26/2002  . Smokeless tobacco: Never Used  . Alcohol Use:  No    Review of Systems Constitutional: No fever/chills Eyes: No visual changes. ENT: No sore throat. Cardiovascular: Denies chest pain. Respiratory: Denies shortness of breath. Gastrointestinal: No abdominal pain.  No nausea, no vomiting.  No diarrhea.  No constipation. Genitourinary: Negative for dysuria. Musculoskeletal: Negative for back pain. Skin: Negative for rash. Neurological: Negative for headaches, focal weakness or numbness.  10-point ROS otherwise negative.  ____________________________________________   PHYSICAL EXAM:  VITAL SIGNS: ED Triage Vitals  Enc Vitals Group     BP 08/21/15 1005 129/61 mmHg     Pulse Rate 08/21/15 1005 75     Resp 08/21/15 1005 20     Temp 08/21/15 1005 97.7 F (36.5 C)     Temp Source 08/21/15 1005 Oral     SpO2 08/21/15 1005 100 %     Weight 08/21/15 1005 200 lb (90.719 kg)     Height 08/21/15 1005  (1.626 m)     Head Cir --      Peak Flow --      Pain Score --      Pain Loc --      Pain Edu? --      Excl. in GC? --     Constitutional: Alert and oriented. Well appearing and in no acute distress. Eyes: Conjunctivae are normal. PERRL. EOMI. Head: Atraumatic. Nose: No congestion/rhinnorhea. Mouth/Throat: Mucous membranes are moist.  Oropharynx non-erythematous. Neck: No stridor.   Cardiovascular: Normal rate, regular rhythm. Grossly normal heart sounds.  Good peripheral circulation. Respiratory: Normal respiratory effort.  No retractions. Lungs CTAB. Gastrointestinal: Soft and nontender. No distention. No CVA tenderness. Genitourinary: deferred Musculoskeletal: No lower extremity tenderness nor edema.  No joint effusions. Neurologic:  Normal speech and language. No gross focal neurologic deficits are appreciated. No gait instability, and relates well and without any assistance. 5 out of 5 strength in bilateral upper and lower extremities, sensation intact to light touch throughout, cranial nerves II through XII  intact. Skin:  Skin is warm, dry and intact. No rash noted. Psychiatric: Mood and affect are normal. Speech and behavior are normal.  ____________________________________________   LABS (all labs ordered are listed, but only abnormal results are displayed)  Labs Reviewed  CBC - Abnormal; Notable for the following:    Hemoglobin 11.9 (*)    All other components within normal limits  URINALYSIS COMPLETEWITH MICROSCOPIC ([facility] ONLY) - Abnormal; Notable for the following:    Color, Urine YELLOW (*)    APPearance CLEAR (*)  Ketones, ur TRACE (*)    Bacteria, UA RARE (*)    Squamous Epithelial / LPF 0-5 (*)    All other components within normal limits  GLUCOSE, CAPILLARY - Abnormal; Notable for the following:    Glucose-Capillary 117 (*)    All other components within normal limits  BASIC METABOLIC PANEL - Abnormal; Notable for the following:    Chloride 100 (*)    Glucose, Bld 123 (*)    Creatinine, Ser 1.03 (*)    GFR calc non Af Amer 48 (*)    GFR calc Af Amer 56 (*)    All other components within normal limits   ____________________________________________  EKG  ED ECG REPORT I, Gayla Doss, the attending physician, personally viewed and interpreted this ECG.   Date: 08/21/2015  EKG Time: 13:38  Rate: 65  Rhythm: sinus rhythm  Axis: Normal  Intervals:none  ST&T Change: No acute ST elevation. Isolated Q-wave. Q-wave in lead 3 and aVF are unchanged from EKG on 11/04/14. Q-wave in V3 seen on EKG 10/15/13  ____________________________________________  RADIOLOGY  none ____________________________________________   PROCEDURES  Procedure(s) performed: None  Critical Care performed: No  ____________________________________________   INITIAL IMPRESSION / ASSESSMENT AND PLAN / ED COURSE  Pertinent labs & imaging results that were available during my care of the patient were reviewed by me and considered in my medical decision making (see chart for  details).  Olivia Chambers is a 81 y.o. female with history of hypertension, hyperlipidemia, chronic kidney disease, diabetes who presents for evaluation of elevated blood pressure today, feeling "jittery", gradual onset today, now resolved. On exam, she is very well-appearing and in no acute distress. Vital signs stable, she is afebrile. She has no complaints at this time, has a benign exam as well as an intact neurological examination. She ambulates well without any assistance. EKG reviewed, not consistent with acute ischemia, in comparison to her prior EKGs appear similar. She has no chest pain, no difficulty breathing, doubt ACS. Not consistent with acute CVA. CBC is notable for mild anemia with hemoglobin 11.9. Metabolic panel notable for mild creatinine elevation at 1.03 however her creatinine was 1.21 when it was last checked. She has had normal glucose while here. Blood pressure has been stable and within an acceptable range. I doubt any acute life-threatening process in this well-appearing patient. We discussed that she should return immediatelty for any recurrent/severe/worsening/change in symptoms and follow-up with her doctor in the morning. She and family at bedside are comfortable with the discharge plan. ____________________________________________   FINAL CLINICAL IMPRESSION(S) / ED DIAGNOSES  Final diagnoses:  Essential hypertension  Type 2 diabetes mellitus without complication      Gayla Doss, MD 08/21/15 1414

## 2015-08-23 ENCOUNTER — Emergency Department: Payer: Commercial Managed Care - HMO

## 2015-08-23 ENCOUNTER — Emergency Department
Admission: EM | Admit: 2015-08-23 | Discharge: 2015-08-23 | Disposition: A | Discharge status: Home / Self Care | Payer: Commercial Managed Care - HMO | Attending: Emergency Medicine | Admitting: Emergency Medicine

## 2015-08-23 ENCOUNTER — Encounter: Payer: Self-pay | Admitting: *Deleted

## 2015-08-23 ENCOUNTER — Telehealth: Payer: Self-pay

## 2015-08-23 DIAGNOSIS — Z87891 Personal history of nicotine dependence: Secondary | ICD-10-CM | POA: Diagnosis not present

## 2015-08-23 DIAGNOSIS — E1122 Type 2 diabetes mellitus with diabetic chronic kidney disease: Secondary | ICD-10-CM | POA: Diagnosis not present

## 2015-08-23 DIAGNOSIS — Z79899 Other long term (current) drug therapy: Secondary | ICD-10-CM | POA: Diagnosis not present

## 2015-08-23 DIAGNOSIS — I129 Hypertensive chronic kidney disease with stage 1 through stage 4 chronic kidney disease, or unspecified chronic kidney disease: Secondary | ICD-10-CM | POA: Diagnosis not present

## 2015-08-23 DIAGNOSIS — I679 Cerebrovascular disease, unspecified: Secondary | ICD-10-CM | POA: Diagnosis present

## 2015-08-23 DIAGNOSIS — R51 Headache: Secondary | ICD-10-CM | POA: Insufficient documentation

## 2015-08-23 DIAGNOSIS — R42 Dizziness and giddiness: Secondary | ICD-10-CM

## 2015-08-23 DIAGNOSIS — H8110 Benign paroxysmal vertigo, unspecified ear: Secondary | ICD-10-CM | POA: Diagnosis not present

## 2015-08-23 DIAGNOSIS — Z7982 Long term (current) use of aspirin: Secondary | ICD-10-CM | POA: Diagnosis not present

## 2015-08-23 DIAGNOSIS — N183 Chronic kidney disease, stage 3 (moderate): Secondary | ICD-10-CM | POA: Diagnosis not present

## 2015-08-23 DIAGNOSIS — R531 Weakness: Secondary | ICD-10-CM | POA: Diagnosis not present

## 2015-08-23 DIAGNOSIS — R509 Fever, unspecified: Secondary | ICD-10-CM | POA: Diagnosis not present

## 2015-08-23 LAB — BASIC METABOLIC PANEL
ANION GAP: 11 (ref 5–15)
BUN: 13 mg/dL (ref 6–20)
CALCIUM: 9.2 mg/dL (ref 8.9–10.3)
CO2: 26 mmol/L (ref 22–32)
CREATININE: 1.06 mg/dL — AB (ref 0.44–1.00)
Chloride: 92 mmol/L — ABNORMAL LOW (ref 101–111)
GFR calc Af Amer: 54 mL/min — ABNORMAL LOW (ref >60)
GFR, EST NON AFRICAN AMERICAN: 47 mL/min — AB (ref >60)
GLUCOSE: 107 mg/dL — AB (ref 65–99)
Potassium: 3.3 mmol/L — ABNORMAL LOW (ref 3.5–5.1)
Sodium: 129 mmol/L — ABNORMAL LOW (ref 135–145)

## 2015-08-23 LAB — CBC
HCT: 35.2 % (ref 35.0–47.0)
HEMOGLOBIN: 11.9 g/dL — AB (ref 12.0–16.0)
MCH: 28.7 pg (ref 26.0–34.0)
MCHC: 33.9 g/dL (ref 32.0–36.0)
MCV: 84.6 fL (ref 80.0–100.0)
PLATELETS: 239 10*3/uL (ref 150–440)
RBC: 4.16 MIL/uL (ref 3.80–5.20)
RDW: 14.2 % (ref 11.5–14.5)
WBC: 3.6 10*3/uL (ref 3.6–11.0)

## 2015-08-23 MED ORDER — MECLIZINE HCL 25 MG PO TABS
25.0000 mg | ORAL_TABLET | Freq: Once | ORAL | Status: AC
  Administered 2015-08-23: 25 mg via ORAL
  Filled 2015-08-23: qty 1

## 2015-08-23 MED ORDER — TRAMADOL HCL 50 MG PO TABS: 50.0000 mg | ORAL_TABLET | Freq: Four times a day (QID) | ORAL | Status: DC | PRN

## 2015-08-23 MED ORDER — MECLIZINE HCL 12.5 MG PO TABS: 12.5000 mg | ORAL_TABLET | Freq: Three times a day (TID) | ORAL | Status: DC | PRN

## 2015-08-23 MED ORDER — POTASSIUM CHLORIDE CRYS ER 20 MEQ PO TBCR
20.0000 meq | EXTENDED_RELEASE_TABLET | Freq: Once | ORAL | Status: AC
  Administered 2015-08-23: 20 meq via ORAL
  Filled 2015-08-23: qty 1

## 2015-08-23 MED ORDER — TRAMADOL HCL 50 MG PO TABS
50.0000 mg | ORAL_TABLET | Freq: Once | ORAL | Status: AC
  Administered 2015-08-23: 50 mg via ORAL
  Filled 2015-08-23: qty 1

## 2015-08-23 NOTE — ED Notes (Signed)
MD Quigley at bedside. 

## 2015-08-23 NOTE — Telephone Encounter (Signed)
Pt called yesterday 08/22/15 stating she was dizzy, I offered her an appointment that am but unable to take due to not having a ride so put her on the schedule for Wednesday am.  Pt called again today on 08/23/15 stating she needed help and was so dizzy and had been sick all day I told her to call 911 or have her daughter take her to the ER.  She agreed.

## 2015-08-23 NOTE — Discharge Instructions (Signed)
Benign Positional Vertigo °Vertigo means you feel like you or your surroundings are moving when they are not. Benign positional vertigo is the most common form of vertigo. Benign means that the cause of your condition is not serious. Benign positional vertigo is more common in older adults. °CAUSES  °Benign positional vertigo is the result of an upset in the labyrinth system. This is an area in the middle ear that helps control your balance. This may be caused by a viral infection, head injury, or repetitive motion. However, often no specific cause is found. °SYMPTOMS  °Symptoms of benign positional vertigo occur when you move your head or eyes in different directions. Some of the symptoms may include: °· Loss of balance and falls. °· Vomiting. °· Blurred vision. °· Dizziness. °· Nausea. °· Involuntary eye movements (nystagmus). °DIAGNOSIS  °Benign positional vertigo is usually diagnosed by physical exam. If the specific cause of your benign positional vertigo is unknown, your caregiver may perform imaging tests, such as magnetic resonance imaging (MRI) or computed tomography (CT). °TREATMENT  °Your caregiver may recommend movements or procedures to correct the benign positional vertigo. Medicines such as meclizine, benzodiazepines, and medicines for nausea may be used to treat your symptoms. In rare cases, if your symptoms are caused by certain conditions that affect the inner ear, you may need surgery. °HOME CARE INSTRUCTIONS  °· Follow your caregiver's instructions. °· Move slowly. Do not make sudden body or head movements. °· Avoid driving. °· Avoid operating heavy machinery. °· Avoid performing any tasks that would be dangerous to you or others during a vertigo episode. °· Drink enough fluids to keep your urine clear or pale yellow. °SEEK IMMEDIATE MEDICAL CARE IF:  °· You develop problems with walking, weakness, numbness, or using your arms, hands, or legs. °· You have difficulty speaking. °· You develop  severe headaches. °· Your nausea or vomiting continues or gets worse. °· You develop visual changes. °· Your family or friends notice any behavioral changes. °· Your condition gets worse. °· You have a fever. °· You develop a stiff neck or sensitivity to light. °MAKE SURE YOU:  °· Understand these instructions. °· Will watch your condition. °· Will get help right away if you are not doing well or get worse. °Document Released: 08/20/2006 Document Revised: 02/04/2012 Document Reviewed: 08/02/2011 °[employer]® Patient Information ©2015 [employer]. This information is not intended to replace advice given to you by your health care provider. Make sure you discuss any questions you have with your health care provider. ° ° °Please return immediately if condition worsens. Please contact her primary physician or the physician you were given for referral. If you have any specialist physicians involved in her treatment and plan please also contact them. Thank you for using [facility]. ° °

## 2015-08-23 NOTE — ED Provider Notes (Signed)
Time Seen: Approximately 1610 I have reviewed the triage notes  Chief Complaint: Dizziness and Headache   History of Present Illness: Olivia Chambers is a 81 y.o. female who presents with feelings of acute onset of dizziness. Further investigation shows it seems to be more vertiginous in nature. Patient states when she got up to walk she felt the room spinning. She could not identify whether or not it was in a clockwise or counterclockwise direction. She states she had some mild fluid sensation in her ears but otherwise no ear pain or deafness. She denies any fever or head trauma. She denies any near syncopal symptoms and states overall she feels improved. She denies any visual field deficits, trouble with speech or swallowing, new focal weakness in either upper or lower extremities, etc.   Past Medical History  Diagnosis Date  . Obesity   . GERD (gastroesophageal reflux disease)   . Hypertension   . Hyperlipidemia   . CKD (chronic kidney disease) stage 3, GFR 30-59 ml/min   . Osteoarthritis   . Dermatitis   . Vitamin D deficiency   . Allergy   . Diabetes mellitus without complication     Patient Active Problem List   Diagnosis Date Noted  . Retinopathy, background, nonproliferative, mild 07/05/2015  . Benign essential HTN 05/22/2015  . Chronic kidney disease (CKD), stage III (moderate) 05/22/2015  . Dyslipidemia 05/22/2015  . Obesity (BMI 30-39.9) 05/22/2015  . Osteoarthrosis, unspecified whether generalized or localized, involving lower leg 05/22/2015  . Allergic rhinitis 05/22/2015  . Diabetes mellitus with renal manifestation 05/22/2015  . Vitamin D deficiency 05/22/2015  . Gastro-esophageal reflux disease without esophagitis 04/28/2007    Past Surgical History  Procedure Laterality Date  . Cataract extraction Left 11/16/2014    Right- 12-04-2014    Past Surgical History  Procedure Laterality Date  . Cataract extraction Left 11/16/2014    Right- 12-04-2014     Current Outpatient Rx  Name  Route  Sig  Dispense  Refill  . ACCU-CHEK FASTCLIX LANCETS MISC               . Alcohol Swabs (B-D SINGLE USE SWABS REGULAR) PADS               . amLODipine (NORVASC) 5 MG tablet   Oral   Take 1 tablet (5 mg total) by mouth daily.   90 tablet   1   . aspirin 81 MG chewable tablet   Oral   Chew 1 tablet by mouth daily.         . Cholecalciferol (VITAMIN D) 2000 UNITS tablet   Oral   Take 1 tablet by mouth daily.         Marland Kitchen glipiZIDE (GLUCOTROL XL) 2.5 MG 24 hr tablet   Oral   Take 1 tablet (2.5 mg total) by mouth daily.   90 tablet   1   . glucose blood (ACCU-CHEK COMPACT PLUS) test strip   Other   1 each by Other route 2 (two) times daily. Use as instructed   100 each   1   . hydrochlorothiazide (HYDRODIURIL) 25 MG tablet   Oral   Take 1 tablet (25 mg total) by mouth daily.   90 tablet   3   . lisinopril (PRINIVIL,ZESTRIL) 40 MG tablet   Oral   Take 1 tablet (40 mg total) by mouth every evening.   90 tablet   3   . meclizine (ANTIVERT) 12.5 MG tablet  Oral   Take 1 tablet (12.5 mg total) by mouth 3 (three) times daily as needed for dizziness or nausea.   30 tablet   1   . metFORMIN (GLUCOPHAGE) 500 MG tablet   Oral   Take 1 tablet (500 mg total) by mouth 2 (two) times daily.   180 tablet   3   . rosuvastatin (CRESTOR) 40 MG tablet   Oral   Take 1 tablet (40 mg total) by mouth daily.   90 tablet   3   . traMADol (ULTRAM) 50 MG tablet   Oral   Take 1 tablet (50 mg total) by mouth every 6 (six) hours as needed.   20 tablet   0     Allergies:  Banana; Pioglitazone; Sitagliptin; and Shrimp  Family History: Family History  Problem Relation Age of Onset  . Hypertension Mother   . Diabetes Mother   . Cancer Father     Prostate  . Hypertension Father   . Diabetes Sister   . Dementia Sister   . Diabetes Brother     Social History: Social History  Substance Use Topics  . Smoking status: Former  Smoker -- 0.50 packs/day    Types: Cigarettes    Quit date: 11/26/2002  . Smokeless tobacco: Never Used  . Alcohol Use: No     Review of Systems:   10 point review of systems was performed and was otherwise negative:  Constitutional: No fever Eyes: No visual disturbances ENT: No sore throat, ear pain Cardiac: No chest pain Respiratory: No shortness of breath, wheezing, or stridor Abdomen: No abdominal pain, no vomiting, No diarrhea Endocrine: No weight loss, No night sweats Extremities: No peripheral edema, cyanosis Skin: No rashes, easy bruising Neurologic: No focal weakness, trouble with speech or swollowing Urologic: No dysuria, Hematuria, or urinary frequency   Physical Exam:  ED Triage Vitals  Enc Vitals Group     BP 08/23/15 1608 147/58 mmHg     Pulse Rate 08/23/15 1608 72     Resp 08/23/15 1608 18     Temp 08/23/15 1608 98 F (36.7 C)     Temp Source 08/23/15 1608 Oral     SpO2 08/23/15 1608 99 %     Weight 08/23/15 1608 200 lb (90.719 kg)     Height 08/23/15 1608  (1.626 m)     Head Cir --      Peak Flow --      Pain Score 08/23/15 1609 7     Pain Loc --      Pain Edu? --      Excl. in GC? --     General: Awake , Alert , and Oriented times 3; GCS 15 Head: Normal cephalic , atraumatic Eyes: Pupils equal , round, reactive to light Tympanic membranes are negative bilaterally for erythema, exudate, or drainage Nose/Throat: No nasal drainage, patent upper airway without erythema or exudate.  Neck: Supple, Full range of motion, No anterior adenopathy or palpable thyroid masses Lungs: Clear to ascultation without wheezes , rhonchi, or rales Heart: Regular rate, regular rhythm without murmurs , gallops , or rubs Abdomen: Soft, non tender without rebound, guarding , or rigidity; bowel sounds positive and symmetric in all 4 quadrants. No organomegaly .        Extremities: 2 plus symmetric pulses. No edema, clubbing or cyanosis Neurologic: normal ambulation,  Motor symmetric without deficits, sensory intact Skin: warm, dry, no rashes   Labs:   All laboratory work was  reviewed including any pertinent negatives or positives listed below:  Labs Reviewed  BASIC METABOLIC PANEL - Abnormal; Notable for the following:    Sodium 129 (*)    Potassium 3.3 (*)    Chloride 92 (*)    Glucose, Bld 107 (*)    Creatinine, Ser 1.06 (*)    GFR calc non Af Amer 47 (*)    GFR calc Af Amer 54 (*)    All other components within normal limits  CBC - Abnormal; Notable for the following:    Hemoglobin 11.9 (*)    All other components within normal limits   review of the laboratory work showed no significant clinical findings.  EKG: * ED ECG REPORT I, Jennye Moccasin, the attending physician, personally viewed and interpreted this ECG.  Date: 08/23/2015 EKG Time: 1635 Rate: 75 Rhythm: normal sinus rhythm QRS Axis: normal Intervals: normal ST/T Wave abnormalities: normal Conduction Disutrbances: none Narrative Interpretation: Poor R-wave progression in the anterior leads, no acute ischemic changes    Radiology:  EXAM: CT HEAD WITHOUT CONTRAST  TECHNIQUE: Contiguous axial images were obtained from the base of the skull through the vertex without intravenous contrast.  COMPARISON: 10/15/2013  FINDINGS: There is no evidence of mass effect, midline shift, or extra-axial fluid collections. There is no evidence of a space-occupying lesion or intracranial hemorrhage. There is no evidence of a cortical-based area of acute infarction. There is generalized cerebral atrophy. There is periventricular white matter low attenuation likely secondary to microangiopathy.  The ventricles and sulci are appropriate for the patient's age. The basal cisterns are patent.  Visualized portions of the orbits are unremarkable. The visualized portions of the paranasal sinuses and mastoid air cells are unremarkable. Cerebrovascular atherosclerotic calcifications  are noted.  The osseous structures are unremarkable.  IMPRESSION: 1. No acute intracranial pathology. 2. Chronic microvascular disease and cerebral atrophy.     I personally reviewed the radiologic studies    ED Course:  The patient's stay here showed some gradual symptomatic improvement. She was given Ultram for her headache and was also established on Antivert for the vertigo symptoms. Patient was up and ambulatory here in emergency department with improvement. I felt this was unlikely to be central vertigo giving the positional component to her vertigo and the lack of any other neurologic deficits at this time. The patient's otherwise hemodynamically stable and I felt could be treated on an outpatient basis with prescriptions for the above-mentioned medications. Differential diagnosis includes but is not exclusive to subarachnoid hemorrhage, meningitis, encephalitis, previous head trauma, cavernous venous thrombosis, muscle tension headache, temporal arteritis, migraine or migraine equivalent, etc. At this time I was not sure of the nature of her headache but did not seem to be a life-threatening source such as a subarachnoid hemorrhage, cavernous venous thrombosis, acute cerebrovascular accident, etc.  Assessment: Acute unspecified cephalgia Benign positional vertigo    Plan:  Outpatient management Patient was advised to return immediately if condition worsens. Patient was advised to follow up with her primary care physician or other specialized physicians involved and in their current assessment.             Jennye Moccasin, MD 08/23/15 [phone]

## 2015-08-23 NOTE — ED Notes (Addendum)
Pt to ED via EMS due to dizziness and HA x4 days. Pt states also has bilateral ear pain. Pt is AAOx3 on arrival, vitals wnl, neuro status fully intact, pt denies pain, states "dizziness is just whats really bothering me". Hx of diabetes, hypertension, and heart murmer.

## 2015-08-24 ENCOUNTER — Ambulatory Visit: Payer: Commercial Managed Care - HMO | Admitting: Family Medicine

## 2015-08-30 ENCOUNTER — Ambulatory Visit (INDEPENDENT_AMBULATORY_CARE_PROVIDER_SITE_OTHER): Payer: Commercial Managed Care - HMO | Admitting: Family Medicine

## 2015-08-30 ENCOUNTER — Encounter: Payer: Self-pay | Admitting: Family Medicine

## 2015-08-30 VITALS — BP 122/62 | HR 84 | Temp 97.8°F | Resp 18 | Ht 64.0 in | Wt 224.9 lb

## 2015-08-30 DIAGNOSIS — R42 Dizziness and giddiness: Secondary | ICD-10-CM | POA: Diagnosis not present

## 2015-08-30 DIAGNOSIS — H9319 Tinnitus, unspecified ear: Secondary | ICD-10-CM | POA: Insufficient documentation

## 2015-08-30 DIAGNOSIS — R011 Cardiac murmur, unspecified: Secondary | ICD-10-CM

## 2015-08-30 DIAGNOSIS — H9313 Tinnitus, bilateral: Secondary | ICD-10-CM

## 2015-08-30 NOTE — Progress Notes (Signed)
Name: Olivia Chambers   MRN: [id number]    DOB: 07-29-1928   Date:08/30/2015       Progress Note  Subjective  Chief Complaint  Chief Complaint  Patient presents with  . Dizziness    has went to ER twice and was dx with vertigo.  She was given meclizine. onset 3 weeks patient states he gets better then worse and is also having ringing in her ears.    HPI  Vertigo: recent trip to [facility] on 08/23/2015. She states started with some tinnitus on right ear, followed by vertigo -(  described as feeling off balance, more than spinning sensation), she states tinnitus got progressively worse, but improving since last night. No fever, no cold symptoms, she had some headaches initially but resolved now, no nausea or vomiting. No weakness or numbness. CT scan brain at [facility] showed microvascular brain disease.  She had similar symptoms and see by ENT in the past, she would like to go back   Patient Active Problem List   Diagnosis Date Noted  . Vertigo 08/30/2015  . Tinnitus 08/30/2015  . Cerebral vascular disease 08/23/2015  . Retinopathy, background, nonproliferative, mild 07/05/2015  . Benign essential HTN 05/22/2015  . Chronic kidney disease (CKD), stage III (moderate) 05/22/2015  . Dyslipidemia 05/22/2015  . Obesity (BMI 30-39.9) 05/22/2015  . Osteoarthrosis, unspecified whether generalized or localized, involving lower leg 05/22/2015  . Allergic rhinitis 05/22/2015  . Diabetes mellitus with renal manifestation ([place]) 05/22/2015  . Vitamin D deficiency 05/22/2015  . Gastro-esophageal reflux disease without esophagitis 04/28/2007    Past Surgical History  Procedure Laterality Date  . Cataract extraction Left 11/16/2014    Right- 12-04-2014    Family History  Problem Relation Age of Onset  . Hypertension Mother   . Diabetes Mother   . Cancer Father     Prostate  . Hypertension Father   . Diabetes Sister   . Dementia Sister   . Diabetes Brother     Social History   Social History  .  Marital Status: Widowed    Spouse Name: N/A  . Number of Children: N/A  . Years of Education: N/A   Occupational History  . Not on file.   Social History Main Topics  . Smoking status: Former Smoker -- 0.50 packs/day    Types: Cigarettes    Quit date: 11/26/2002  . Smokeless tobacco: Never Used  . Alcohol Use: No  . Drug Use: No  . Sexual Activity: No   Other Topics Concern  . Not on file   Social History Narrative     Current outpatient prescriptions:  .  ACCU-CHEK FASTCLIX LANCETS MISC, , Disp: , Rfl:  .  Alcohol Swabs (B-D SINGLE USE SWABS REGULAR) PADS, , Disp: , Rfl:  .  amLODipine (NORVASC) 5 MG tablet, Take 1 tablet (5 mg total) by mouth daily., Disp: 90 tablet, Rfl: 1 .  aspirin 81 MG chewable tablet, Chew 1 tablet by mouth daily., Disp: , Rfl:  .  Cholecalciferol (VITAMIN D) 2000 UNITS tablet, Take 1 tablet by mouth daily., Disp: , Rfl:  .  glipiZIDE (GLUCOTROL XL) 2.5 MG 24 hr tablet, Take 1 tablet (2.5 mg total) by mouth daily., Disp: 90 tablet, Rfl: 1 .  glucose blood (ACCU-CHEK COMPACT PLUS) test strip, 1 each by Other route 2 (two) times daily. Use as instructed, Disp: 100 each, Rfl: 1 .  hydrochlorothiazide (HYDRODIURIL) 25 MG tablet, Take 1 tablet (25 mg total) by mouth daily., Disp: 90  tablet, Rfl: 3 .  lisinopril (PRINIVIL,ZESTRIL) 40 MG tablet, Take 1 tablet (40 mg total) by mouth every evening., Disp: 90 tablet, Rfl: 3 .  meclizine (ANTIVERT) 12.5 MG tablet, Take 1 tablet (12.5 mg total) by mouth 3 (three) times daily as needed for dizziness or nausea., Disp: 30 tablet, Rfl: 1 .  metFORMIN (GLUCOPHAGE) 500 MG tablet, Take 1 tablet (500 mg total) by mouth 2 (two) times daily., Disp: 180 tablet, Rfl: 3 .  rosuvastatin (CRESTOR) 40 MG tablet, Take 1 tablet (40 mg total) by mouth daily., Disp: 90 tablet, Rfl: 3 .  traMADol (ULTRAM) 50 MG tablet, Take 1 tablet (50 mg total) by mouth every 6 (six) hours as needed., Disp: 20 tablet, Rfl: 0  Allergies  Allergen  Reactions  . Banana Anaphylaxis  . Pioglitazone     pt states causes headaches  . Sitagliptin Other (See Comments)  . Shrimp [Shellfish Allergy] Hives and Rash     ROS  Constitutional: Negative for fever or weight change.  Respiratory: Negative for cough and shortness of breath.   Cardiovascular: Negative for chest pain or palpitations.  Gastrointestinal: Negative for abdominal pain, no bowel changes.  Musculoskeletal: Negative for gait problem or joint swelling.  Skin: Negative for rash.  Neurological: Positive  for dizziness no  headache.  No other specific complaints in a complete review of systems (except as listed in HPI above).  Objective  Filed Vitals:   08/30/15 0941  BP: 122/62  Pulse: 84  Temp: 97.8 F (36.6 C)  TempSrc: Oral  Resp: 18  Height: '5\' 4"'  (1.626 m)  Weight: 224 lb 14.4 oz (102.014 kg)  SpO2: 98%    Body mass index is 38.59 kg/(m^2).  Physical Exam  Constitutional: Patient appears well-developed. Obese No distress.  HEENT: head atraumatic, normocephalic, pupils equal and reactive to light, ears TM normal left side, right side not visualized because of wax , neck supple, throat within normal limits Cardiovascular: Normal rate, regular rhythm and normal heart sounds.  Systolic ejection murmur 2/6 No BLE edema. Pulmonary/Chest: Effort normal and breath sounds normal. No respiratory distress. Abdominal: Soft.  There is no tenderness. Psychiatric: Patient has a normal mood and affect. behavior is normal. Judgment and thought content normal Neurological:  Cranial nerves negative, romberg negative, normal sensation, normal grip   Recent Results (from the past 2160 hour(s))  Glucose, capillary     Status: Abnormal   Collection Time: 08/21/15  9:59 AM  Result Value Ref Range   Glucose-Capillary 117 (H) 65 - 99 mg/dL  CBC     Status: Abnormal   Collection Time: 08/21/15 10:20 AM  Result Value Ref Range   WBC 3.7 3.6 - 11.0 K/uL   RBC 4.17 3.80 -  5.20 MIL/uL   Hemoglobin 11.9 (L) 12.0 - 16.0 g/dL   HCT 36.1 35.0 - 47.0 %   MCV 86.5 80.0 - 100.0 fL   MCH 28.5 26.0 - 34.0 pg   MCHC 32.9 32.0 - 36.0 g/dL   RDW 14.0 11.5 - 14.5 %   Platelets 246 150 - 440 K/uL  Urinalysis complete, with microscopic ([facility] only)     Status: Abnormal   Collection Time: 08/21/15 10:20 AM  Result Value Ref Range   Color, Urine YELLOW (A) YELLOW   APPearance CLEAR (A) CLEAR   Glucose, UA NEGATIVE NEGATIVE mg/dL   Bilirubin Urine NEGATIVE NEGATIVE   Ketones, ur TRACE (A) NEGATIVE mg/dL   Specific Gravity, Urine 1.011 1.005 - 1.030  Hgb urine dipstick NEGATIVE NEGATIVE   pH 7.0 5.0 - 8.0   Protein, ur NEGATIVE NEGATIVE mg/dL   Nitrite NEGATIVE NEGATIVE   Leukocytes, UA NEGATIVE NEGATIVE   RBC / HPF 0-5 0 - 5 RBC/hpf   WBC, UA 0-5 0 - 5 WBC/hpf   Bacteria, UA RARE (A) NONE SEEN   Squamous Epithelial / LPF 0-5 (A) NONE SEEN   Mucous PRESENT   Basic metabolic panel     Status: Abnormal   Collection Time: 08/21/15 10:20 AM  Result Value Ref Range   Sodium 139 135 - 145 mmol/L   Potassium 3.7 3.5 - 5.1 mmol/L   Chloride 100 (L) 101 - 111 mmol/L   CO2 29 22 - 32 mmol/L   Glucose, Bld 123 (H) 65 - 99 mg/dL   BUN 15 6 - 20 mg/dL   Creatinine, Ser 1.03 (H) 0.44 - 1.00 mg/dL   Calcium 9.9 8.9 - 10.3 mg/dL   GFR calc non Af Amer 48 (L) >60 mL/min   GFR calc Af Amer 56 (L) >60 mL/min    Comment: (NOTE) The eGFR has been calculated using the CKD EPI equation. This calculation has not been validated in all clinical situations. eGFR's persistently <60 mL/min signify possible Chronic Kidney Disease.    Anion gap 10 5 - 15  Basic metabolic panel     Status: Abnormal   Collection Time: 08/23/15  4:19 PM  Result Value Ref Range   Sodium 129 (L) 135 - 145 mmol/L   Potassium 3.3 (L) 3.5 - 5.1 mmol/L   Chloride 92 (L) 101 - 111 mmol/L   CO2 26 22 - 32 mmol/L   Glucose, Bld 107 (H) 65 - 99 mg/dL   BUN 13 6 - 20 mg/dL   Creatinine, Ser 1.06 (H) 0.44 -  1.00 mg/dL   Calcium 9.2 8.9 - 10.3 mg/dL   GFR calc non Af Amer 47 (L) >60 mL/min   GFR calc Af Amer 54 (L) >60 mL/min    Comment: (NOTE) The eGFR has been calculated using the CKD EPI equation. This calculation has not been validated in all clinical situations. eGFR's persistently <60 mL/min signify possible Chronic Kidney Disease.    Anion gap 11 5 - 15  CBC     Status: Abnormal   Collection Time: 08/23/15  4:19 PM  Result Value Ref Range   WBC 3.6 3.6 - 11.0 K/uL   RBC 4.16 3.80 - 5.20 MIL/uL   Hemoglobin 11.9 (L) 12.0 - 16.0 g/dL   HCT 35.2 35.0 - 47.0 %   MCV 84.6 80.0 - 100.0 fL   MCH 28.7 26.0 - 34.0 pg   MCHC 33.9 32.0 - 36.0 g/dL   RDW 14.2 11.5 - 14.5 %   Platelets 239 150 - 440 K/uL      PHQ2/9: Depression screen [facility] 2/9 08/15/2015 05/24/2015  Decreased Interest 0 0  Down, Depressed, Hopeless 0 0  PHQ - 2 Score 0 0     Fall Risk: Fall Risk  08/15/2015 05/24/2015  Falls in the past year? No No     Assessment & Plan  1. Vertigo  She is taking Meclizine, advised to take prn - Ambulatory referral to ENT  2. Tinnitus, bilateral  Denies hearing loss.  - Ambulatory referral to ENT

## 2015-09-01 ENCOUNTER — Telehealth: Payer: Self-pay

## 2015-09-01 NOTE — Telephone Encounter (Signed)
Patient was calling about her ENT referral and gave patient the phone number and informed her Burnett Harry had faxed over all the information. But patient states her Vertigo is getting worst is there something you can call in for her?

## 2015-09-01 NOTE — Telephone Encounter (Signed)
She was given prescription for 30 pills and more refill to [facility], prescription was given on 08/25/2015 - I can't send anything else at this time.

## 2015-09-06 ENCOUNTER — Telehealth: Payer: Self-pay

## 2015-09-06 DIAGNOSIS — N183 Chronic kidney disease, stage 3 unspecified: Secondary | ICD-10-CM

## 2015-09-06 DIAGNOSIS — E1122 Type 2 diabetes mellitus with diabetic chronic kidney disease: Secondary | ICD-10-CM

## 2015-09-06 NOTE — Telephone Encounter (Signed)
Patient requesting refill. 

## 2015-09-08 ENCOUNTER — Other Ambulatory Visit: Payer: Self-pay | Admitting: Family Medicine

## 2015-09-08 DIAGNOSIS — E1122 Type 2 diabetes mellitus with diabetic chronic kidney disease: Secondary | ICD-10-CM

## 2015-09-08 DIAGNOSIS — N183 Chronic kidney disease, stage 3 unspecified: Secondary | ICD-10-CM

## 2015-09-08 MED ORDER — GLUCOSE BLOOD VI STRP: ORAL_STRIP | Status: DC

## 2015-09-08 MED ORDER — TRUE METRIX METER W/DEVICE KIT: 1.0000 [IU] | PACK | Freq: Every day | Status: AC

## 2015-09-08 MED ORDER — GLUCOSE BLOOD VI STRP: ORAL_STRIP | Status: AC

## 2015-09-08 MED ORDER — TRUE METRIX METER W/DEVICE KIT: 1.0000 [IU] | PACK | Freq: Every day | Status: DC

## 2015-09-09 DIAGNOSIS — H9313 Tinnitus, bilateral: Secondary | ICD-10-CM | POA: Diagnosis not present

## 2015-09-09 DIAGNOSIS — H6121 Impacted cerumen, right ear: Secondary | ICD-10-CM | POA: Diagnosis not present

## 2015-09-09 DIAGNOSIS — H903 Sensorineural hearing loss, bilateral: Secondary | ICD-10-CM | POA: Diagnosis not present

## 2015-09-09 DIAGNOSIS — R42 Dizziness and giddiness: Secondary | ICD-10-CM | POA: Diagnosis not present

## 2015-09-10 ENCOUNTER — Encounter: Payer: Self-pay | Admitting: Family Medicine

## 2015-09-10 DIAGNOSIS — H903 Sensorineural hearing loss, bilateral: Secondary | ICD-10-CM | POA: Insufficient documentation

## 2015-09-16 DIAGNOSIS — R42 Dizziness and giddiness: Secondary | ICD-10-CM | POA: Diagnosis not present

## 2015-09-21 DIAGNOSIS — J301 Allergic rhinitis due to pollen: Secondary | ICD-10-CM | POA: Diagnosis not present

## 2015-09-21 DIAGNOSIS — R42 Dizziness and giddiness: Secondary | ICD-10-CM | POA: Diagnosis not present

## 2015-09-23 ENCOUNTER — Encounter: Payer: Self-pay | Admitting: Family Medicine

## 2015-09-23 ENCOUNTER — Ambulatory Visit (INDEPENDENT_AMBULATORY_CARE_PROVIDER_SITE_OTHER): Payer: Commercial Managed Care - HMO | Admitting: Family Medicine

## 2015-09-23 VITALS — BP 122/68 | HR 78 | Temp 97.8°F | Resp 16 | Ht 64.0 in | Wt 219.4 lb

## 2015-09-23 DIAGNOSIS — N183 Chronic kidney disease, stage 3 unspecified: Secondary | ICD-10-CM

## 2015-09-23 DIAGNOSIS — D649 Anemia, unspecified: Secondary | ICD-10-CM

## 2015-09-23 DIAGNOSIS — E1121 Type 2 diabetes mellitus with diabetic nephropathy: Secondary | ICD-10-CM

## 2015-09-23 DIAGNOSIS — E11329 Type 2 diabetes mellitus with mild nonproliferative diabetic retinopathy without macular edema: Secondary | ICD-10-CM

## 2015-09-23 DIAGNOSIS — E559 Vitamin D deficiency, unspecified: Secondary | ICD-10-CM | POA: Diagnosis not present

## 2015-09-23 DIAGNOSIS — E785 Hyperlipidemia, unspecified: Secondary | ICD-10-CM

## 2015-09-23 DIAGNOSIS — I10 Essential (primary) hypertension: Secondary | ICD-10-CM | POA: Diagnosis not present

## 2015-09-23 DIAGNOSIS — E669 Obesity, unspecified: Secondary | ICD-10-CM | POA: Diagnosis not present

## 2015-09-23 DIAGNOSIS — E119 Type 2 diabetes mellitus without complications: Secondary | ICD-10-CM

## 2015-09-23 DIAGNOSIS — E1159 Type 2 diabetes mellitus with other circulatory complications: Secondary | ICD-10-CM

## 2015-09-23 DIAGNOSIS — I152 Hypertension secondary to endocrine disorders: Secondary | ICD-10-CM

## 2015-09-23 DIAGNOSIS — H9313 Tinnitus, bilateral: Secondary | ICD-10-CM | POA: Diagnosis not present

## 2015-09-23 DIAGNOSIS — E1169 Type 2 diabetes mellitus with other specified complication: Secondary | ICD-10-CM

## 2015-09-23 DIAGNOSIS — E11319 Type 2 diabetes mellitus with unspecified diabetic retinopathy without macular edema: Secondary | ICD-10-CM | POA: Insufficient documentation

## 2015-09-23 DIAGNOSIS — E113299 Type 2 diabetes mellitus with mild nonproliferative diabetic retinopathy without macular edema, unspecified eye: Secondary | ICD-10-CM | POA: Diagnosis not present

## 2015-09-23 LAB — POCT GLYCOSYLATED HEMOGLOBIN (HGB A1C): Hemoglobin A1C: 6.6

## 2015-09-23 LAB — POCT UA - MICROALBUMIN: Microalbumin Ur, POC: NEGATIVE mg/L

## 2015-09-23 NOTE — Progress Notes (Signed)
Name: Olivia Chambers   MRN: [id number]    DOB: March 11, 1928   Date:09/23/2015       Progress Note  Subjective  Chief Complaint  Chief Complaint  Patient presents with  . Medication Refill    follow-up  . Diabetes    checks BG 2x day low-73, high-170's  . Hypertension  . Hyperlipidemia    HPI  DMII with renal manifestation and nonproliferative diabetic retinopathy: [name] is a 81 year old pleasant female, that came in today for follow up. She checks her fsbs twice daily and no recent episodes of hypoglycemia. . She is compliant with her medications, hgbA1C was up to 7.3% on her last visit, but because of her age that is still considered at goal. She has CKI stage 3 and is taking lisinopril daily. She was reminded to have regular follow ups with ophthalmologist.  We have discussed stopping Glipizide many times because of risk of hypoglycemia but patient would like to continue it at this time, if glucose drops we will stop medication and increase Metformin  HTN: bp is towards the low end of normal, she denies orthostatic changes, dizziness or syncope, no chest pain or palpitation. She is compliant with her medications  Hyperlipidemia: taking Crestor daily and LDL was 100 and HDL 68 in Dec 2015, denies side effects of medications, such myalgia.  Due for repeat labs  Obesity: She has lost some weight since last visit, She states last times she had heavier shoes on, she has not changed her diet  Tinnitus: seen by Dr. Richardson Landry using two nasal sprays and this morning symptoms are under control. Denies vertigo.    Patient Active Problem List   Diagnosis Date Noted  . Diabetes mellitus with diabetic retinopathy 09/23/2015  . Hearing loss sensory, bilateral 09/10/2015  . Vertigo 08/30/2015  . Tinnitus 08/30/2015  . Systolic ejection murmur 96/22/2979  . Cerebral vascular disease 08/23/2015  . Retinopathy, background, nonproliferative, mild 07/05/2015  . Benign essential HTN 05/22/2015   . Chronic kidney disease (CKD), stage III (moderate) 05/22/2015  . Dyslipidemia 05/22/2015  . Obesity, diabetes, and hypertension syndrome ([place]) 05/22/2015  . Osteoarthrosis, unspecified whether generalized or localized, involving lower leg 05/22/2015  . Allergic rhinitis 05/22/2015  . Diabetes mellitus with renal manifestation ([place]) 05/22/2015  . Vitamin D deficiency 05/22/2015  . Gastro-esophageal reflux disease without esophagitis 04/28/2007    Past Surgical History  Procedure Laterality Date  . Cataract extraction Left 11/16/2014    Right- 12-04-2014    Family History  Problem Relation Age of Onset  . Hypertension Mother   . Diabetes Mother   . Cancer Father     Prostate  . Hypertension Father   . Diabetes Sister   . Dementia Sister   . Diabetes Brother     Social History   Social History  . Marital Status: Widowed    Spouse Name: N/A  . Number of Children: N/A  . Years of Education: N/A   Occupational History  . Not on file.   Social History Main Topics  . Smoking status: Former Smoker -- 0.50 packs/day    Types: Cigarettes    Quit date: 11/26/2002  . Smokeless tobacco: Never Used  . Alcohol Use: No  . Drug Use: No  . Sexual Activity: No   Other Topics Concern  . Not on file   Social History Narrative     Current outpatient prescriptions:  .  atorvastatin (LIPITOR) 40 MG tablet, Take 40 mg by mouth  daily., Disp: , Rfl:  .  Alcohol Swabs (B-D SINGLE USE SWABS REGULAR) PADS, , Disp: , Rfl:  .  amLODipine (NORVASC) 5 MG tablet, Take 1 tablet (5 mg total) by mouth daily., Disp: 90 tablet, Rfl: 1 .  aspirin 81 MG chewable tablet, Chew 1 tablet by mouth daily., Disp: , Rfl:  .  azelastine (ASTELIN) 0.1 % nasal spray, , Disp: , Rfl:  .  Blood Glucose Monitoring Suppl (TRUE METRIX METER) W/DEVICE KIT, 1 Units by Does not apply route daily., Disp: 1 kit, Rfl: 0 .  Cholecalciferol (VITAMIN D) 2000 UNITS tablet, Take 1 tablet by mouth daily., Disp: , Rfl:  .   fluticasone (FLONASE) 50 MCG/ACT nasal spray, , Disp: , Rfl:  .  glipiZIDE (GLUCOTROL XL) 2.5 MG 24 hr tablet, Take 1 tablet (2.5 mg total) by mouth daily., Disp: 90 tablet, Rfl: 1 .  glucose blood (TRUE METRIX BLOOD GLUCOSE TEST) test strip, Twice daily, Disp: 100 each, Rfl: 12 .  hydrochlorothiazide (HYDRODIURIL) 25 MG tablet, Take 1 tablet (25 mg total) by mouth daily., Disp: 90 tablet, Rfl: 3 .  lisinopril (PRINIVIL,ZESTRIL) 40 MG tablet, Take 1 tablet (40 mg total) by mouth every evening., Disp: 90 tablet, Rfl: 3 .  meclizine (ANTIVERT) 12.5 MG tablet, Take 1 tablet (12.5 mg total) by mouth 3 (three) times daily as needed for dizziness or nausea., Disp: 30 tablet, Rfl: 1 .  metFORMIN (GLUCOPHAGE) 500 MG tablet, Take 1 tablet (500 mg total) by mouth 2 (two) times daily., Disp: 180 tablet, Rfl: 3 .  TRUEPLUS LANCETS 33G MISC, , Disp: , Rfl:   Allergies  Allergen Reactions  . Banana Anaphylaxis  . Pioglitazone     pt states causes headaches  . Sitagliptin Other (See Comments)  . Shrimp [Shellfish Allergy] Hives and Rash     ROS  Constitutional: Negative for fever, positive for mild  weight change.  Respiratory: Negative for cough and shortness of breath.   Cardiovascular: Negative for chest pain or palpitations.  Gastrointestinal: Negative for abdominal pain, no bowel changes.  Musculoskeletal: Negative for gait problem or joint swelling.  Skin: Negative for rash.  Neurological: Negative for dizziness or headache.  No other specific complaints in a complete review of systems (except as listed in HPI above).  Objective  Filed Vitals:   09/23/15 0909  BP: 122/68  Pulse: 78  Temp: 97.8 F (36.6 C)  TempSrc: Oral  Resp: 16  Height: '5\' 4"'  (1.626 m)  Weight: 219 lb 6.4 oz (99.519 kg)  SpO2: 94%    Body mass index is 37.64 kg/(m^2).  Physical Exam  Constitutional: Patient appears well-developed and well-nourished. Obese No distress.  HEENT: head atraumatic,  normocephalic, pupils equal and reactive to light, ears normal TM, boggy turbinates, neck supple, throat within normal limits Cardiovascular: Normal rate, regular rhythm and normal heart sounds.  No murmur heard. Trace  BLE edema. Pulmonary/Chest: Effort normal and breath sounds normal. No respiratory distress. Abdominal: Soft.  There is no tenderness. Psychiatric: Patient has a normal mood and affect. behavior is normal. Judgment and thought content normal. Muscular Skeletal: some atrophy of dorsal left hand muscle, but normal grip  Recent Results (from the past 2160 hour(s))  Glucose, capillary     Status: Abnormal   Collection Time: 08/21/15  9:59 AM  Result Value Ref Range   Glucose-Capillary 117 (H) 65 - 99 mg/dL  CBC     Status: Abnormal   Collection Time: 08/21/15 10:20 AM  Result Value  Ref Range   WBC 3.7 3.6 - 11.0 K/uL   RBC 4.17 3.80 - 5.20 MIL/uL   Hemoglobin 11.9 (L) 12.0 - 16.0 g/dL   HCT 36.1 35.0 - 47.0 %   MCV 86.5 80.0 - 100.0 fL   MCH 28.5 26.0 - 34.0 pg   MCHC 32.9 32.0 - 36.0 g/dL   RDW 14.0 11.5 - 14.5 %   Platelets 246 150 - 440 K/uL  Urinalysis complete, with microscopic ([facility] only)     Status: Abnormal   Collection Time: 08/21/15 10:20 AM  Result Value Ref Range   Color, Urine YELLOW (A) YELLOW   APPearance CLEAR (A) CLEAR   Glucose, UA NEGATIVE NEGATIVE mg/dL   Bilirubin Urine NEGATIVE NEGATIVE   Ketones, ur TRACE (A) NEGATIVE mg/dL   Specific Gravity, Urine 1.011 1.005 - 1.030   Hgb urine dipstick NEGATIVE NEGATIVE   pH 7.0 5.0 - 8.0   Protein, ur NEGATIVE NEGATIVE mg/dL   Nitrite NEGATIVE NEGATIVE   Leukocytes, UA NEGATIVE NEGATIVE   RBC / HPF 0-5 0 - 5 RBC/hpf   WBC, UA 0-5 0 - 5 WBC/hpf   Bacteria, UA RARE (A) NONE SEEN   Squamous Epithelial / LPF 0-5 (A) NONE SEEN   Mucous PRESENT   Basic metabolic panel     Status: Abnormal   Collection Time: 08/21/15 10:20 AM  Result Value Ref Range   Sodium 139 135 - 145 mmol/L   Potassium 3.7 3.5 - 5.1  mmol/L   Chloride 100 (L) 101 - 111 mmol/L   CO2 29 22 - 32 mmol/L   Glucose, Bld 123 (H) 65 - 99 mg/dL   BUN 15 6 - 20 mg/dL   Creatinine, Ser 1.03 (H) 0.44 - 1.00 mg/dL   Calcium 9.9 8.9 - 10.3 mg/dL   GFR calc non Af Amer 48 (L) >60 mL/min   GFR calc Af Amer 56 (L) >60 mL/min    Comment: (NOTE) The eGFR has been calculated using the CKD EPI equation. This calculation has not been validated in all clinical situations. eGFR's persistently <60 mL/min signify possible Chronic Kidney Disease.    Anion gap 10 5 - 15  Basic metabolic panel     Status: Abnormal   Collection Time: 08/23/15  4:19 PM  Result Value Ref Range   Sodium 129 (L) 135 - 145 mmol/L   Potassium 3.3 (L) 3.5 - 5.1 mmol/L   Chloride 92 (L) 101 - 111 mmol/L   CO2 26 22 - 32 mmol/L   Glucose, Bld 107 (H) 65 - 99 mg/dL   BUN 13 6 - 20 mg/dL   Creatinine, Ser 1.06 (H) 0.44 - 1.00 mg/dL   Calcium 9.2 8.9 - 10.3 mg/dL   GFR calc non Af Amer 47 (L) >60 mL/min   GFR calc Af Amer 54 (L) >60 mL/min    Comment: (NOTE) The eGFR has been calculated using the CKD EPI equation. This calculation has not been validated in all clinical situations. eGFR's persistently <60 mL/min signify possible Chronic Kidney Disease.    Anion gap 11 5 - 15  CBC     Status: Abnormal   Collection Time: 08/23/15  4:19 PM  Result Value Ref Range   WBC 3.6 3.6 - 11.0 K/uL   RBC 4.16 3.80 - 5.20 MIL/uL   Hemoglobin 11.9 (L) 12.0 - 16.0 g/dL   HCT 35.2 35.0 - 47.0 %   MCV 84.6 80.0 - 100.0 fL   MCH 28.7 26.0 - 34.0  pg   MCHC 33.9 32.0 - 36.0 g/dL   RDW 14.2 11.5 - 14.5 %   Platelets 239 150 - 440 K/uL    PHQ2/9: Depression screen [facility] 2/9 08/15/2015 05/24/2015  Decreased Interest 0 0  Down, Depressed, Hopeless 0 0  PHQ - 2 Score 0 0     Fall Risk: Fall Risk  08/15/2015 05/24/2015  Falls in the past year? No No    Assessment & Plan  1. Type 2 diabetes mellitus with diabetic nephropathy, without long-term current use of insulin  ([place])  Discussed importance of monitoring for hypoglycemic episodes, recheck hgbA1C, continue ACE, and life style modifications, needs to have eye exam at least once year - POCT HgB A1C - Hemoglobin A1c - POCT UA - Microalbumin  2. Dyslipidemia  - Lipid panel  3. Benign essential HTN  At goal, no dizziness - Comprehensive metabolic panel  4. Chronic kidney disease (CKD), stage III (moderate)  Recheck labs  5. Type 2 diabetes mellitus with mild nonproliferative retinopathy, macular edema presence unspecified  - Hemoglobin A1c  6. Obesity, diabetes, and hypertension syndrome ([place])  Discussed diet and exercise  7. Vitamin D deficiency  - Vit D  25 hydroxy (rtn osteoporosis monitoring)  8. Tinnitus, bilateral  Seen by ENT, doing better with nose sprays

## 2015-09-24 LAB — COMPREHENSIVE METABOLIC PANEL
ALK PHOS: 51 IU/L (ref 39–117)
ALT: 14 IU/L (ref 0–32)
AST: 17 IU/L (ref 0–40)
Albumin/Globulin Ratio: 1.8 (ref 1.1–2.5)
Albumin: 4.5 g/dL (ref 3.5–4.7)
BILIRUBIN TOTAL: 0.8 mg/dL (ref 0.0–1.2)
BUN / CREAT RATIO: 17 (ref 11–26)
BUN: 18 mg/dL (ref 8–27)
CHLORIDE: 98 mmol/L (ref 97–106)
CO2: 26 mmol/L (ref 18–29)
CREATININE: 1.03 mg/dL — AB (ref 0.57–1.00)
Calcium: 9.9 mg/dL (ref 8.7–10.3)
GFR calc Af Amer: 57 mL/min/{1.73_m2} — ABNORMAL LOW (ref >59)
GFR calc non Af Amer: 50 mL/min/{1.73_m2} — ABNORMAL LOW (ref >59)
GLOBULIN, TOTAL: 2.5 g/dL (ref 1.5–4.5)
GLUCOSE: 88 mg/dL (ref 65–99)
Potassium: 4.6 mmol/L (ref 3.5–5.2)
SODIUM: 140 mmol/L (ref 136–144)
Total Protein: 7 g/dL (ref 6.0–8.5)

## 2015-09-24 LAB — CBC WITH DIFFERENTIAL/PLATELET
Basophils Absolute: 0 10*3/uL (ref 0.0–0.2)
Basos: 0 %
EOS (ABSOLUTE): 0.1 10*3/uL (ref 0.0–0.4)
Eos: 2 %
HEMOGLOBIN: 11.5 g/dL (ref 11.1–15.9)
Hematocrit: 36.6 % (ref 34.0–46.6)
IMMATURE GRANS (ABS): 0 10*3/uL (ref 0.0–0.1)
IMMATURE GRANULOCYTES: 0 %
LYMPHS: 47 %
Lymphocytes Absolute: 1.7 10*3/uL (ref 0.7–3.1)
MCH: 27.1 pg (ref 26.6–33.0)
MCHC: 31.4 g/dL — ABNORMAL LOW (ref 31.5–35.7)
MCV: 86 fL (ref 79–97)
MONOCYTES: 10 %
Monocytes Absolute: 0.4 10*3/uL (ref 0.1–0.9)
NEUTROS PCT: 41 %
Neutrophils Absolute: 1.5 10*3/uL (ref 1.4–7.0)
PLATELETS: 275 10*3/uL (ref 150–379)
RBC: 4.25 x10E6/uL (ref 3.77–5.28)
RDW: 13.9 % (ref 12.3–15.4)
WBC: 3.6 10*3/uL (ref 3.4–10.8)

## 2015-09-24 LAB — LIPID PANEL
CHOL/HDL RATIO: 2.3 ratio (ref 0.0–4.4)
CHOLESTEROL TOTAL: 120 mg/dL (ref 100–199)
HDL: 52 mg/dL (ref >39)
LDL Calculated: 56 mg/dL (ref 0–99)
TRIGLYCERIDES: 62 mg/dL (ref 0–149)
VLDL Cholesterol Cal: 12 mg/dL (ref 5–40)

## 2015-09-24 LAB — HEMOGLOBIN A1C
Est. average glucose Bld gHb Est-mCnc: 157 mg/dL
Hgb A1c MFr Bld: 7.1 % — ABNORMAL HIGH (ref 4.8–5.6)

## 2015-09-24 LAB — FERRITIN: FERRITIN: 61 ng/mL (ref 15–150)

## 2015-09-24 LAB — VITAMIN D 25 HYDROXY (VIT D DEFICIENCY, FRACTURES): VIT D 25 HYDROXY: 38.8 ng/mL (ref 30.0–100.0)

## 2015-10-07 ENCOUNTER — Telehealth: Payer: Self-pay | Admitting: Family Medicine

## 2015-10-07 NOTE — Telephone Encounter (Signed)
Pt would like a call back please. °

## 2015-10-10 NOTE — Telephone Encounter (Signed)
Patient came in today to show her how to use her glucose monitor

## 2015-10-25 ENCOUNTER — Other Ambulatory Visit: Payer: Self-pay | Admitting: Family Medicine

## 2015-10-25 MED ORDER — ATORVASTATIN CALCIUM 40 MG PO TABS: 40.0000 mg | ORAL_TABLET | Freq: Every day | ORAL | Status: AC

## 2015-10-25 NOTE — Telephone Encounter (Signed)
PT NEEDS REFILL ON CHOLESTEROL MEDS TO PHARM [facility] ORDER.

## 2015-11-08 ENCOUNTER — Other Ambulatory Visit: Payer: Self-pay

## 2015-11-08 DIAGNOSIS — I10 Essential (primary) hypertension: Secondary | ICD-10-CM

## 2015-11-08 MED ORDER — AMLODIPINE BESYLATE 5 MG PO TABS: 5.0000 mg | ORAL_TABLET | Freq: Every day | ORAL | Status: DC

## 2015-11-08 MED ORDER — HYDROCHLOROTHIAZIDE 25 MG PO TABS: 25.0000 mg | ORAL_TABLET | Freq: Every day | ORAL | Status: DC

## 2015-11-08 NOTE — Telephone Encounter (Signed)
Patient requesting refill. 

## 2015-11-10 ENCOUNTER — Other Ambulatory Visit: Payer: Self-pay

## 2015-11-10 DIAGNOSIS — I10 Essential (primary) hypertension: Secondary | ICD-10-CM

## 2015-11-11 MED ORDER — AMLODIPINE BESYLATE 5 MG PO TABS: 5.0000 mg | ORAL_TABLET | Freq: Every day | ORAL | Status: DC

## 2015-11-11 MED ORDER — HYDROCHLOROTHIAZIDE 25 MG PO TABS: 25.0000 mg | ORAL_TABLET | Freq: Every day | ORAL | Status: DC

## 2015-12-09 DIAGNOSIS — I10 Essential (primary) hypertension: Secondary | ICD-10-CM | POA: Diagnosis not present

## 2015-12-09 DIAGNOSIS — E7800 Pure hypercholesterolemia, unspecified: Secondary | ICD-10-CM | POA: Diagnosis not present

## 2015-12-09 DIAGNOSIS — Z8639 Personal history of other endocrine, nutritional and metabolic disease: Secondary | ICD-10-CM | POA: Diagnosis not present

## 2015-12-09 DIAGNOSIS — E1129 Type 2 diabetes mellitus with other diabetic kidney complication: Secondary | ICD-10-CM | POA: Diagnosis not present

## 2015-12-12 DIAGNOSIS — Z8639 Personal history of other endocrine, nutritional and metabolic disease: Secondary | ICD-10-CM | POA: Diagnosis not present

## 2015-12-12 DIAGNOSIS — E7800 Pure hypercholesterolemia, unspecified: Secondary | ICD-10-CM | POA: Diagnosis not present

## 2015-12-12 DIAGNOSIS — E1129 Type 2 diabetes mellitus with other diabetic kidney complication: Secondary | ICD-10-CM | POA: Diagnosis not present

## 2015-12-12 DIAGNOSIS — I10 Essential (primary) hypertension: Secondary | ICD-10-CM | POA: Diagnosis not present

## 2015-12-12 DIAGNOSIS — K219 Gastro-esophageal reflux disease without esophagitis: Secondary | ICD-10-CM | POA: Diagnosis not present

## 2016-01-05 ENCOUNTER — Other Ambulatory Visit: Payer: Self-pay | Admitting: Family Medicine

## 2016-01-06 NOTE — Telephone Encounter (Signed)
Patient requesting refill. 

## 2016-01-06 NOTE — Telephone Encounter (Signed)
Left voice message to inform prescription is at pharmacy and that she need to sch appointment.

## 2016-01-24 ENCOUNTER — Ambulatory Visit: Payer: Commercial Managed Care - HMO | Admitting: Family Medicine

## 2016-04-09 DIAGNOSIS — K219 Gastro-esophageal reflux disease without esophagitis: Secondary | ICD-10-CM | POA: Diagnosis not present

## 2016-04-09 DIAGNOSIS — E1129 Type 2 diabetes mellitus with other diabetic kidney complication: Secondary | ICD-10-CM | POA: Diagnosis not present

## 2016-04-09 DIAGNOSIS — I10 Essential (primary) hypertension: Secondary | ICD-10-CM | POA: Diagnosis not present

## 2016-04-09 DIAGNOSIS — E7800 Pure hypercholesterolemia, unspecified: Secondary | ICD-10-CM | POA: Diagnosis not present

## 2016-04-09 DIAGNOSIS — N183 Chronic kidney disease, stage 3 (moderate): Secondary | ICD-10-CM | POA: Diagnosis not present

## 2016-04-09 DIAGNOSIS — D638 Anemia in other chronic diseases classified elsewhere: Secondary | ICD-10-CM | POA: Diagnosis not present

## 2016-05-26 ENCOUNTER — Other Ambulatory Visit: Payer: Self-pay | Admitting: Family Medicine

## 2016-05-28 NOTE — Telephone Encounter (Signed)
Tried contacting patient but no voice mail set up will try again tomorrow

## 2016-05-30 NOTE — Telephone Encounter (Signed)
Patient wanted me to inform you that she had transferred from this practice that she is not a patient here. Please notate in the system.

## 2016-08-06 DIAGNOSIS — I10 Essential (primary) hypertension: Secondary | ICD-10-CM | POA: Diagnosis not present

## 2016-08-06 DIAGNOSIS — E7800 Pure hypercholesterolemia, unspecified: Secondary | ICD-10-CM | POA: Diagnosis not present

## 2016-08-06 DIAGNOSIS — E1129 Type 2 diabetes mellitus with other diabetic kidney complication: Secondary | ICD-10-CM | POA: Diagnosis not present

## 2016-08-06 DIAGNOSIS — D638 Anemia in other chronic diseases classified elsewhere: Secondary | ICD-10-CM | POA: Diagnosis not present

## 2016-08-13 DIAGNOSIS — I10 Essential (primary) hypertension: Secondary | ICD-10-CM | POA: Diagnosis not present

## 2016-08-13 DIAGNOSIS — Z0000 Encounter for general adult medical examination without abnormal findings: Secondary | ICD-10-CM | POA: Diagnosis not present

## 2016-08-13 DIAGNOSIS — E119 Type 2 diabetes mellitus without complications: Secondary | ICD-10-CM | POA: Diagnosis not present

## 2016-08-13 DIAGNOSIS — Z6841 Body Mass Index (BMI) 40.0 and over, adult: Secondary | ICD-10-CM | POA: Diagnosis not present

## 2016-08-13 DIAGNOSIS — Z23 Encounter for immunization: Secondary | ICD-10-CM | POA: Diagnosis not present

## 2016-08-13 DIAGNOSIS — E7800 Pure hypercholesterolemia, unspecified: Secondary | ICD-10-CM | POA: Diagnosis not present

## 2016-08-20 ENCOUNTER — Emergency Department: Payer: Commercial Managed Care - HMO

## 2016-08-20 ENCOUNTER — Emergency Department
Admission: EM | Admit: 2016-08-20 | Discharge: 2016-08-20 | Disposition: A | Discharge status: Home / Self Care | Payer: Commercial Managed Care - HMO | Attending: Emergency Medicine | Admitting: Emergency Medicine

## 2016-08-20 DIAGNOSIS — Z87891 Personal history of nicotine dependence: Secondary | ICD-10-CM | POA: Insufficient documentation

## 2016-08-20 DIAGNOSIS — Z79899 Other long term (current) drug therapy: Secondary | ICD-10-CM | POA: Diagnosis not present

## 2016-08-20 DIAGNOSIS — E1122 Type 2 diabetes mellitus with diabetic chronic kidney disease: Secondary | ICD-10-CM | POA: Diagnosis not present

## 2016-08-20 DIAGNOSIS — Z7982 Long term (current) use of aspirin: Secondary | ICD-10-CM | POA: Insufficient documentation

## 2016-08-20 DIAGNOSIS — Z7984 Long term (current) use of oral hypoglycemic drugs: Secondary | ICD-10-CM | POA: Insufficient documentation

## 2016-08-20 DIAGNOSIS — N183 Chronic kidney disease, stage 3 (moderate): Secondary | ICD-10-CM | POA: Insufficient documentation

## 2016-08-20 DIAGNOSIS — I129 Hypertensive chronic kidney disease with stage 1 through stage 4 chronic kidney disease, or unspecified chronic kidney disease: Secondary | ICD-10-CM | POA: Diagnosis not present

## 2016-08-20 DIAGNOSIS — R51 Headache: Secondary | ICD-10-CM | POA: Diagnosis not present

## 2016-08-20 DIAGNOSIS — I10 Essential (primary) hypertension: Secondary | ICD-10-CM | POA: Diagnosis not present

## 2016-08-20 DIAGNOSIS — R531 Weakness: Secondary | ICD-10-CM | POA: Diagnosis not present

## 2016-08-20 DIAGNOSIS — R42 Dizziness and giddiness: Secondary | ICD-10-CM | POA: Diagnosis not present

## 2016-08-20 LAB — URINALYSIS COMPLETE WITH MICROSCOPIC (ARMC ONLY)
Bacteria, UA: NONE SEEN
Bilirubin Urine: NEGATIVE
Glucose, UA: NEGATIVE mg/dL
HGB URINE DIPSTICK: NEGATIVE
KETONES UR: NEGATIVE mg/dL
Leukocytes, UA: NEGATIVE
NITRITE: NEGATIVE
PH: 5 (ref 5.0–8.0)
Protein, ur: NEGATIVE mg/dL
SPECIFIC GRAVITY, URINE: 1.024 (ref 1.005–1.030)

## 2016-08-20 LAB — COMPREHENSIVE METABOLIC PANEL
ALT: 17 U/L (ref 14–54)
ANION GAP: 9 (ref 5–15)
AST: 22 U/L (ref 15–41)
Albumin: 4 g/dL (ref 3.5–5.0)
Alkaline Phosphatase: 54 U/L (ref 38–126)
BUN: 23 mg/dL — ABNORMAL HIGH (ref 6–20)
CHLORIDE: 104 mmol/L (ref 101–111)
CO2: 26 mmol/L (ref 22–32)
CREATININE: 1.14 mg/dL — AB (ref 0.44–1.00)
Calcium: 9.4 mg/dL (ref 8.9–10.3)
GFR calc non Af Amer: 42 mL/min — ABNORMAL LOW (ref >60)
GFR, EST AFRICAN AMERICAN: 49 mL/min — AB (ref >60)
Glucose, Bld: 154 mg/dL — ABNORMAL HIGH (ref 65–99)
Potassium: 3.7 mmol/L (ref 3.5–5.1)
SODIUM: 139 mmol/L (ref 135–145)
Total Bilirubin: 0.6 mg/dL (ref 0.3–1.2)
Total Protein: 7.4 g/dL (ref 6.5–8.1)

## 2016-08-20 LAB — CBC WITH DIFFERENTIAL/PLATELET
BASOS PCT: 1 %
Basophils Absolute: 0 10*3/uL (ref 0–0.1)
EOS ABS: 0.2 10*3/uL (ref 0–0.7)
EOS PCT: 4 %
HCT: 37.4 % (ref 35.0–47.0)
Hemoglobin: 12.5 g/dL (ref 12.0–16.0)
LYMPHS ABS: 1.6 10*3/uL (ref 1.0–3.6)
Lymphocytes Relative: 34 %
MCH: 28.7 pg (ref 26.0–34.0)
MCHC: 33.3 g/dL (ref 32.0–36.0)
MCV: 86.1 fL (ref 80.0–100.0)
Monocytes Absolute: 0.4 10*3/uL (ref 0.2–0.9)
Monocytes Relative: 10 %
Neutro Abs: 2.4 10*3/uL (ref 1.4–6.5)
Neutrophils Relative %: 51 %
PLATELETS: 224 10*3/uL (ref 150–440)
RBC: 4.34 MIL/uL (ref 3.80–5.20)
RDW: 14.2 % (ref 11.5–14.5)
WBC: 4.6 10*3/uL (ref 3.6–11.0)

## 2016-08-20 LAB — TROPONIN I: Troponin I: <0.03 ng/mL (ref <0.03)

## 2016-08-20 MED ORDER — SODIUM CHLORIDE 0.9 % IV BOLUS (SEPSIS)
500.0000 mL | Freq: Once | INTRAVENOUS | Status: AC
  Administered 2016-08-20: 500 mL via INTRAVENOUS

## 2016-08-20 NOTE — ED Provider Notes (Signed)
-----------------------------------------   10:19 PM on 08/20/2016 -----------------------------------------  Patient is in no acute distress.  Chest x-ray and urinalysis were unremarkable.  We will discharge as per the plan from Dr. Inocencio HomesGayle.  I updated the patient and she agrees.  Dg Chest 2 View  Result Date: 08/20/2016 CLINICAL DATA:  82 y/o  F; dizziness and hypertension. EXAM: CHEST  2 VIEW COMPARISON:  09/11/2008 chest radiograph. FINDINGS: Stable cardiomediastinal silhouette within normal limits. Clear lungs. No pneumothorax or pleural effusion. Multilevel degenerative changes of the thoracic spine. IMPRESSION: No active cardiopulmonary disease. Electronically Signed   By: Mitzi HansenLance  Furusawa-Stratton M.D.   On: 08/20/2016 20:56   Ct Head Wo Contrast  Result Date: 08/20/2016 CLINICAL DATA:  82 y/o  F; hypertension and headache. EXAM: CT HEAD WITHOUT CONTRAST TECHNIQUE: Contiguous axial images were obtained from the base of the skull through the vertex without intravenous contrast. COMPARISON:  08/23/2015 and 09/15/2008 CT head. FINDINGS: Brain: No evidence of acute infarction, hemorrhage, hydrocephalus, extra-axial collection or mass lesion/mass effect. Stable mild chronic microvascular ischemic changes and parenchymal volume loss. Vascular: No hyperdense vessel. Calcific atherosclerosis of cavernous internal carotid arteries. Skull: Normal. Negative for fracture or focal lesion. Stable nonspecific lucency in right parietal bone, possibly hemangioma. Chronic right lamina papyracea fracture. Sinuses/Orbits: No acute finding. Other: None. IMPRESSION: No acute intracranial abnormality is identified. Stable mild chronic microvascular ischemic changes and parenchymal volume loss for age. Electronically Signed   By: Mitzi HansenLance  Furusawa-Stratton M.D.   On: 08/20/2016 20:29     ----------------------------------------- 9:03 PM on 08/20/2016 -----------------------------------------   Blood pressure (!)  150/66, pulse 78, temperature 97.6 F (36.4 C), temperature source Oral, resp. rate 18, height 5\' 4"  (1.626 m), weight 106.4 kg, SpO2 100 %.  Assuming care from Dr. Inocencio HomesGayle.  In short, Olivia Chambers is a 82 y.o. female with a chief complaint of Dizziness .  Refer to the original H&P for additional details.  The current plan of care is to follow up CXR and UA and reassess.  Likely discharge and follow up with PCP.    Loleta Roseory Shyquan Stallbaumer, MD 08/20/16 2220

## 2016-08-20 NOTE — ED Provider Notes (Signed)
[facility] Emergency Department Provider Note   ____________________________________________   First MD Initiated Contact with Patient 08/20/16 1940     (approximate)  I have reviewed the triage vital signs and the nursing notes.   HISTORY  Chief Complaint Dizziness    HPI Olivia Chambers is a 82 y.o. female with history of hypertension, hyperlipidemia, CKD, diabetes presents for evaluation of lightheadedness and hypertension today, gradual onset, constant, mild, no modifying factors. Patient reports that she was on the phone with her insurance company this afternoon "for 3 hours" about her medications and she was getting the "run around" and was getting upset. She began feeling lightheaded and "cold and shaky all over". She reports she took her blood pressure with a home blood pressure monitor in her systolic was "924". She thinks she may have gotten "worked up" over the  phone conversation. She called 911 and on EMS arrival her blood pressure was 130/70 and then 164/84. She reports she having a mild frontal headache but otherwise, she reports she feels well. She denies any chest pain, no difficulty breathing, no recent illness including no vomiting, diarrhea, fevers or chills. No pain or burning with urination.   Past Medical History:  Diagnosis Date  . Allergy   . CKD (chronic kidney disease) stage 3, GFR 30-59 ml/min   . Dermatitis   . Diabetes mellitus without complication ([place])   . GERD (gastroesophageal reflux disease)   . Hyperlipidemia   . Hypertension   . Obesity   . Osteoarthritis   . Vitamin D deficiency     Patient Active Problem List   Diagnosis Date Noted  . Diabetes mellitus with diabetic retinopathy 09/23/2015  . Hearing loss sensory, bilateral 09/10/2015  . Vertigo 08/30/2015  . Tinnitus 08/30/2015  . Systolic ejection murmur 46/28/6381  . Cerebral vascular disease 08/23/2015  . Retinopathy, background, nonproliferative, mild  07/05/2015  . Benign essential HTN 05/22/2015  . Chronic kidney disease (CKD), stage III (moderate) 05/22/2015  . Dyslipidemia 05/22/2015  . Obesity, diabetes, and hypertension syndrome ([place]) 05/22/2015  . Osteoarthrosis, unspecified whether generalized or localized, involving lower leg 05/22/2015  . Allergic rhinitis 05/22/2015  . Diabetes mellitus with renal manifestation ([place]) 05/22/2015  . Vitamin D deficiency 05/22/2015  . Gastro-esophageal reflux disease without esophagitis 04/28/2007    Past Surgical History:  Procedure Laterality Date  . CATARACT EXTRACTION Left 11/16/2014   Right- 12-04-2014    Prior to Admission medications   Medication Sig Start Date End Date Taking? Authorizing Provider  Alcohol Swabs (B-D SINGLE USE SWABS REGULAR) PADS  05/07/15   Historical Provider, MD  amLODipine (NORVASC) 5 MG tablet Take 1 tablet (5 mg total) by mouth daily. 11/11/15   Steele Sizer, MD  aspirin 81 MG chewable tablet Chew 1 tablet by mouth daily. 08/28/07   Historical Provider, MD  atorvastatin (LIPITOR) 40 MG tablet Take 1 tablet (40 mg total) by mouth daily. 10/25/15   Steele Sizer, MD  azelastine (ASTELIN) 0.1 % nasal spray  09/22/15   Historical Provider, MD  Blood Glucose Monitoring Suppl (TRUE METRIX METER) W/DEVICE KIT 1 Units by Does not apply route daily. 09/08/15   Steele Sizer, MD  Cholecalciferol (VITAMIN D) 2000 UNITS tablet Take 1 tablet by mouth daily. 10/09/11   Historical Provider, MD  fluticasone Asencion Islam) 50 MCG/ACT nasal spray  09/21/15   Historical Provider, MD  glipiZIDE (GLUCOTROL XL) 2.5 MG 24 hr tablet TAKE 1 TABLET EVERY DAY 01/06/16   Steele Sizer, MD  glucose blood (TRUE METRIX BLOOD GLUCOSE TEST) test strip Twice daily 09/08/15   Steele Sizer, MD  hydrochlorothiazide (HYDRODIURIL) 25 MG tablet Take 1 tablet (25 mg total) by mouth daily. 11/11/15   Steele Sizer, MD  lisinopril (PRINIVIL,ZESTRIL) 40 MG tablet Take 1 tablet (40 mg total) by mouth  every evening. 05/24/15   Steele Sizer, MD  meclizine (ANTIVERT) 12.5 MG tablet Take 1 tablet (12.5 mg total) by mouth 3 (three) times daily as needed for dizziness or nausea. 08/23/15   Daymon Larsen, MD  metFORMIN (GLUCOPHAGE) 500 MG tablet TAKE 1 TABLET TWICE DAILY 05/27/16   Steele Sizer, MD  TRUEPLUS LANCETS 33G MISC  09/09/15   Historical Provider, MD    Allergies Banana; Pioglitazone; Sitagliptin; and Shrimp [shellfish allergy]  Family History  Problem Relation Age of Onset  . Hypertension Mother   . Diabetes Mother   . Cancer Father     Prostate  . Hypertension Father   . Diabetes Sister   . Dementia Sister   . Diabetes Brother     Social History Social History  Substance Use Topics  . Smoking status: Former Smoker    Packs/day: 0.50    Types: Cigarettes    Quit date: 11/26/2002  . Smokeless tobacco: Never Used  . Alcohol use No    Review of Systems Constitutional: No fever/chills Eyes: No visual changes. ENT: No sore throat. Cardiovascular: Denies chest pain. Respiratory: Denies shortness of breath. Gastrointestinal: No abdominal pain.  No nausea, no vomiting.  No diarrhea.  No constipation. Genitourinary: Negative for dysuria. Musculoskeletal: Negative for back pain. Skin: Negative for rash. Neurological: Negative for headaches, focal weakness or numbness.  10-point ROS otherwise negative.  ____________________________________________   PHYSICAL EXAM: 1946 Vital Signs Vital Signs -  ECG Heart Rate: 77  Resp: 19  BP: 148/67  MAP (mmHg): 90   1940 Vital Signs Vital Signs -  Temp: 97.6 F (36.4 C)  Temp Source: Oral  Pulse Rate: 83  Resp: 18  BP Location: Right Arm  BP Method: Automatic  Patient Position (if appropriate): Lying  Oxygen Therapy -  O2 Device: Room Air     Vitals:   08/20/16 1950 08/20/16 2015 08/20/16 2030 08/20/16 2045  BP: (!) 166/72 (!) 146/62 (!) 149/59 (!) 150/66  Pulse:  78    Resp: (!) '22 19 19 18  ' Temp:      TempSrc:        SpO2:  100%    Weight:      Height:        VITAL SIGNS: ED Triage Vitals  Enc Vitals Group     BP --      Pulse --      Resp --      Temp --      Temp src --      SpO2 --      Weight 08/20/16 1939 234 lb 9.6 oz (106.4 kg)     Height 08/20/16 1939 '5\' 4"'  (1.626 m)     Head Circumference --      Peak Flow --      Pain Score 08/20/16 1936 0     Pain Loc --      Pain Edu? --      Excl. in [place]? --     Constitutional: Alert and oriented. Well appearing and in no acute distress. Eyes: Conjunctivae are normal. PERRL. EOMI. Head: Atraumatic. Nose: No congestion/rhinnorhea. Ears: Clear TMs bilaterally Mouth/Throat: Mucous membranes are moist.  Oropharynx non-erythematous. Neck: No stridor.  Supple without meningismus. Cardiovascular: Normal rate, regular rhythm. Grossly normal heart sounds.  Good peripheral circulation. Respiratory: Normal respiratory effort.  No retractions. Lungs CTAB. Gastrointestinal: Soft and nontender. No distention.  No CVA tenderness. Genitourinary: Deferred Musculoskeletal: No lower extremity tenderness nor edema.  No joint effusions. Neurologic:  Normal speech and language. No gross focal neurologic deficits are appreciated. No gait instability. 5 out of 5 strength in bilateral upper and lower extremitites; sensation intact to light touch throughout, cranial nerves II through XII intact, normal finger-nose-finger. Skin:  Skin is warm, dry and intact. No rash noted. Psychiatric: Mood and affect are normal. Speech and behavior are normal.  ____________________________________________   LABS (all labs ordered are listed, but only abnormal results are displayed)  Labs Reviewed  COMPREHENSIVE METABOLIC PANEL - Abnormal; Notable for the following:       Result Value   Glucose, Bld 154 (*)    BUN 23 (*)    Creatinine, Ser 1.14 (*)    GFR calc non Af Amer 42 (*)    GFR calc Af Amer 49 (*)    All other components within normal limits  CBC WITH  DIFFERENTIAL/PLATELET  TROPONIN I  URINALYSIS COMPLETEWITH MICROSCOPIC ([facility] ONLY)   ____________________________________________  EKG  ED ECG REPORT I, Joanne Gavel, the attending physician, personally viewed and interpreted this ECG.   Date: 08/20/2016  EKG Time: 20:14  Rate: 80  Rhythm: normal sinus rhythm  Axis: normal  Intervals:first-degree A-V block   ST&T Change: normal  ____________________________________________  RADIOLOGY  CT head   IMPRESSION:  No acute intracranial abnormality is identified. Stable mild chronic  microvascular ischemic changes and parenchymal volume loss for age.       CXR -pending ____________________________________________   PROCEDURES  Procedure(s) performed: None  Procedures  Critical Care performed: No  ____________________________________________   INITIAL IMPRESSION / ASSESSMENT AND PLAN / ED COURSE  Pertinent labs & imaging results that were available during my care of the patient were reviewed by me and considered in my medical decision making (see chart for details).  LYA HOLBEN is a 82 y.o. female with history of hypertension, hyperlipidemia, CKD, diabetes presents for evaluation of lightheadedness and hypertension today. She is also complaining of very mild frontal headache currently. On exam, she is very well-appearing and in no acute distress. Vital signs stable, she is afebrile. Blood pressure on arrival is 148/67 which is reassuring. She has a benign physical examination, intact neurological examination, doubt acute CVA. She ambulates well in my presence without any ataxia. EKG not consistent with acute ischemia, we'll obtain screening labs, CT head, chest x-ray, urinalysis and reassess for disposition.  ----------------------------------------- 8:55 PM on 08/20/2016 ----------------------------------------- Patient resting comfortably in no acute distress. I reviewed her labs CBC generally unremarkable,  negative troponin. CT head shows no acute intracranial process. EKG not consistent with acute ischemia. CMP shows mild creatinine elevation of 1.14 and 1 her orthostatic vital signs were being taken, her heart rate did increase by 16 bpm upon standing and I suspect she may be mildly dehydrated, will give light IV fluids. Chest x-ray and urinalysis pending. If unremarkable and she continues to appear well, I suspect she can be discharged with close outpatient follow-up.Care to Dr. Karma Greaser at this time.  Clinical Course     ____________________________________________   FINAL CLINICAL IMPRESSION(S) / ED DIAGNOSES  Final diagnoses:  Lightheadedness      NEW MEDICATIONS STARTED DURING THIS VISIT:  New  Prescriptions   No medications on file     Note:  This document was prepared using Dragon voice recognition software and may include unintentional dictation errors.    Joanne Gavel, MD 08/20/16 2109
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
## 2016-08-21 DIAGNOSIS — Z6841 Body Mass Index (BMI) 40.0 and over, adult: Secondary | ICD-10-CM | POA: Diagnosis not present

## 2016-08-21 DIAGNOSIS — R42 Dizziness and giddiness: Secondary | ICD-10-CM | POA: Diagnosis not present

## 2016-11-06 ENCOUNTER — Other Ambulatory Visit: Payer: Self-pay | Admitting: Family Medicine

## 2016-11-06 DIAGNOSIS — E1122 Type 2 diabetes mellitus with diabetic chronic kidney disease: Secondary | ICD-10-CM

## 2016-11-06 DIAGNOSIS — N183 Chronic kidney disease, stage 3 (moderate): Principal | ICD-10-CM

## 2016-11-07 NOTE — Telephone Encounter (Signed)
Patient requesting refill of True Metrix and Lancets.

## 2016-12-06 DIAGNOSIS — E119 Type 2 diabetes mellitus without complications: Secondary | ICD-10-CM | POA: Diagnosis not present

## 2016-12-06 DIAGNOSIS — E7800 Pure hypercholesterolemia, unspecified: Secondary | ICD-10-CM | POA: Diagnosis not present

## 2016-12-06 DIAGNOSIS — I10 Essential (primary) hypertension: Secondary | ICD-10-CM | POA: Diagnosis not present

## 2016-12-18 DIAGNOSIS — E119 Type 2 diabetes mellitus without complications: Secondary | ICD-10-CM | POA: Diagnosis not present

## 2016-12-18 DIAGNOSIS — E7800 Pure hypercholesterolemia, unspecified: Secondary | ICD-10-CM | POA: Diagnosis not present

## 2016-12-18 DIAGNOSIS — Z6841 Body Mass Index (BMI) 40.0 and over, adult: Secondary | ICD-10-CM | POA: Diagnosis not present

## 2016-12-18 DIAGNOSIS — I10 Essential (primary) hypertension: Secondary | ICD-10-CM | POA: Diagnosis not present

## 2017-01-28 ENCOUNTER — Emergency Department: Payer: No Typology Code available for payment source

## 2017-01-28 ENCOUNTER — Encounter: Payer: Self-pay | Admitting: Emergency Medicine

## 2017-01-28 ENCOUNTER — Emergency Department
Admission: EM | Admit: 2017-01-28 | Discharge: 2017-01-28 | Disposition: A | Discharge status: Home / Self Care | Payer: No Typology Code available for payment source | Attending: Emergency Medicine | Admitting: Emergency Medicine

## 2017-01-28 DIAGNOSIS — R51 Headache: Secondary | ICD-10-CM | POA: Diagnosis not present

## 2017-01-28 DIAGNOSIS — S199XXA Unspecified injury of neck, initial encounter: Secondary | ICD-10-CM | POA: Diagnosis not present

## 2017-01-28 DIAGNOSIS — I129 Hypertensive chronic kidney disease with stage 1 through stage 4 chronic kidney disease, or unspecified chronic kidney disease: Secondary | ICD-10-CM | POA: Insufficient documentation

## 2017-01-28 DIAGNOSIS — Z7982 Long term (current) use of aspirin: Secondary | ICD-10-CM | POA: Diagnosis not present

## 2017-01-28 DIAGNOSIS — Z87891 Personal history of nicotine dependence: Secondary | ICD-10-CM | POA: Insufficient documentation

## 2017-01-28 DIAGNOSIS — S39012A Strain of muscle, fascia and tendon of lower back, initial encounter: Secondary | ICD-10-CM | POA: Diagnosis not present

## 2017-01-28 DIAGNOSIS — S3992XA Unspecified injury of lower back, initial encounter: Secondary | ICD-10-CM | POA: Diagnosis not present

## 2017-01-28 DIAGNOSIS — Z79899 Other long term (current) drug therapy: Secondary | ICD-10-CM | POA: Diagnosis not present

## 2017-01-28 DIAGNOSIS — E1122 Type 2 diabetes mellitus with diabetic chronic kidney disease: Secondary | ICD-10-CM | POA: Diagnosis not present

## 2017-01-28 DIAGNOSIS — Y9389 Activity, other specified: Secondary | ICD-10-CM | POA: Insufficient documentation

## 2017-01-28 DIAGNOSIS — S0990XA Unspecified injury of head, initial encounter: Secondary | ICD-10-CM | POA: Insufficient documentation

## 2017-01-28 DIAGNOSIS — A000 Cholera due to Vibrio cholerae 01, biovar cholerae: Secondary | ICD-10-CM | POA: Diagnosis not present

## 2017-01-28 DIAGNOSIS — Z7984 Long term (current) use of oral hypoglycemic drugs: Secondary | ICD-10-CM | POA: Diagnosis not present

## 2017-01-28 DIAGNOSIS — N183 Chronic kidney disease, stage 3 (moderate): Secondary | ICD-10-CM | POA: Insufficient documentation

## 2017-01-28 DIAGNOSIS — Y999 Unspecified external cause status: Secondary | ICD-10-CM | POA: Diagnosis not present

## 2017-01-28 DIAGNOSIS — Y92410 Unspecified street and highway as the place of occurrence of the external cause: Secondary | ICD-10-CM | POA: Insufficient documentation

## 2017-01-28 DIAGNOSIS — M542 Cervicalgia: Secondary | ICD-10-CM | POA: Diagnosis not present

## 2017-01-28 LAB — GLUCOSE, CAPILLARY: GLUCOSE-CAPILLARY: 119 mg/dL — AB (ref 65–99)

## 2017-01-28 MED ORDER — ACETAMINOPHEN 325 MG PO TABS
650.0000 mg | ORAL_TABLET | Freq: Once | ORAL | Status: AC
  Administered 2017-01-28: 650 mg via ORAL
  Filled 2017-01-28: qty 2
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
## 2017-01-28 NOTE — ED Notes (Signed)
AAOx3.  Skin warm and dry.  NAD 

## 2017-01-30 DIAGNOSIS — H2511 Age-related nuclear cataract, right eye: Secondary | ICD-10-CM | POA: Diagnosis not present

## 2017-02-01 DIAGNOSIS — Z043 Encounter for examination and observation following other accident: Secondary | ICD-10-CM | POA: Diagnosis not present

## 2017-02-01 DIAGNOSIS — S161XXD Strain of muscle, fascia and tendon at neck level, subsequent encounter: Secondary | ICD-10-CM | POA: Diagnosis not present

## 2017-02-01 DIAGNOSIS — S39012D Strain of muscle, fascia and tendon of lower back, subsequent encounter: Secondary | ICD-10-CM | POA: Diagnosis not present

## 2017-02-07 DIAGNOSIS — F418 Other specified anxiety disorders: Secondary | ICD-10-CM | POA: Diagnosis not present

## 2017-02-21 DIAGNOSIS — R42 Dizziness and giddiness: Secondary | ICD-10-CM | POA: Diagnosis not present

## 2017-04-11 DIAGNOSIS — E119 Type 2 diabetes mellitus without complications: Secondary | ICD-10-CM | POA: Diagnosis not present

## 2017-04-11 DIAGNOSIS — I10 Essential (primary) hypertension: Secondary | ICD-10-CM | POA: Diagnosis not present

## 2017-04-11 DIAGNOSIS — E7800 Pure hypercholesterolemia, unspecified: Secondary | ICD-10-CM | POA: Diagnosis not present

## 2017-04-18 DIAGNOSIS — E7800 Pure hypercholesterolemia, unspecified: Secondary | ICD-10-CM | POA: Diagnosis not present

## 2017-04-18 DIAGNOSIS — F418 Other specified anxiety disorders: Secondary | ICD-10-CM | POA: Diagnosis not present

## 2017-04-18 DIAGNOSIS — E119 Type 2 diabetes mellitus without complications: Secondary | ICD-10-CM | POA: Diagnosis not present

## 2017-04-18 DIAGNOSIS — E669 Obesity, unspecified: Secondary | ICD-10-CM | POA: Diagnosis not present

## 2017-04-18 DIAGNOSIS — I10 Essential (primary) hypertension: Secondary | ICD-10-CM | POA: Diagnosis not present

## 2017-10-15 DIAGNOSIS — E119 Type 2 diabetes mellitus without complications: Secondary | ICD-10-CM | POA: Diagnosis not present

## 2017-10-15 DIAGNOSIS — E7800 Pure hypercholesterolemia, unspecified: Secondary | ICD-10-CM | POA: Diagnosis not present

## 2017-10-15 DIAGNOSIS — I10 Essential (primary) hypertension: Secondary | ICD-10-CM | POA: Diagnosis not present

## 2017-10-22 DIAGNOSIS — I10 Essential (primary) hypertension: Secondary | ICD-10-CM | POA: Diagnosis not present

## 2017-10-22 DIAGNOSIS — Z6841 Body Mass Index (BMI) 40.0 and over, adult: Secondary | ICD-10-CM | POA: Diagnosis not present

## 2017-10-22 DIAGNOSIS — E7800 Pure hypercholesterolemia, unspecified: Secondary | ICD-10-CM | POA: Diagnosis not present

## 2017-10-22 DIAGNOSIS — E119 Type 2 diabetes mellitus without complications: Secondary | ICD-10-CM | POA: Diagnosis not present

## 2017-10-22 DIAGNOSIS — Z0000 Encounter for general adult medical examination without abnormal findings: Secondary | ICD-10-CM | POA: Diagnosis not present

## 2017-11-28 DIAGNOSIS — M79605 Pain in left leg: Secondary | ICD-10-CM | POA: Diagnosis not present

## 2018-02-28 DIAGNOSIS — E119 Type 2 diabetes mellitus without complications: Secondary | ICD-10-CM | POA: Diagnosis not present

## 2018-02-28 DIAGNOSIS — E7800 Pure hypercholesterolemia, unspecified: Secondary | ICD-10-CM | POA: Diagnosis not present

## 2018-02-28 DIAGNOSIS — I10 Essential (primary) hypertension: Secondary | ICD-10-CM | POA: Diagnosis not present

## 2018-02-28 DIAGNOSIS — Z6841 Body Mass Index (BMI) 40.0 and over, adult: Secondary | ICD-10-CM | POA: Diagnosis not present

## 2018-03-03 DIAGNOSIS — E7800 Pure hypercholesterolemia, unspecified: Secondary | ICD-10-CM | POA: Diagnosis not present

## 2018-03-03 DIAGNOSIS — I10 Essential (primary) hypertension: Secondary | ICD-10-CM | POA: Diagnosis not present

## 2018-03-03 DIAGNOSIS — E119 Type 2 diabetes mellitus without complications: Secondary | ICD-10-CM | POA: Diagnosis not present

## 2018-07-08 DIAGNOSIS — I10 Essential (primary) hypertension: Secondary | ICD-10-CM | POA: Diagnosis not present

## 2018-07-08 DIAGNOSIS — E119 Type 2 diabetes mellitus without complications: Secondary | ICD-10-CM | POA: Diagnosis not present

## 2018-07-15 DIAGNOSIS — E119 Type 2 diabetes mellitus without complications: Secondary | ICD-10-CM | POA: Diagnosis not present

## 2018-07-15 DIAGNOSIS — E7800 Pure hypercholesterolemia, unspecified: Secondary | ICD-10-CM | POA: Diagnosis not present

## 2018-07-15 DIAGNOSIS — I10 Essential (primary) hypertension: Secondary | ICD-10-CM | POA: Diagnosis not present

## 2018-07-15 DIAGNOSIS — Z6841 Body Mass Index (BMI) 40.0 and over, adult: Secondary | ICD-10-CM | POA: Diagnosis not present

## 2018-11-13 DIAGNOSIS — E7800 Pure hypercholesterolemia, unspecified: Secondary | ICD-10-CM | POA: Diagnosis not present

## 2018-11-13 DIAGNOSIS — E119 Type 2 diabetes mellitus without complications: Secondary | ICD-10-CM | POA: Diagnosis not present

## 2018-11-13 DIAGNOSIS — I10 Essential (primary) hypertension: Secondary | ICD-10-CM | POA: Diagnosis not present

## 2018-11-20 DIAGNOSIS — Z0000 Encounter for general adult medical examination without abnormal findings: Secondary | ICD-10-CM | POA: Diagnosis not present

## 2018-11-20 DIAGNOSIS — E1122 Type 2 diabetes mellitus with diabetic chronic kidney disease: Secondary | ICD-10-CM | POA: Diagnosis not present

## 2018-11-20 DIAGNOSIS — N183 Chronic kidney disease, stage 3 (moderate): Secondary | ICD-10-CM | POA: Diagnosis not present

## 2018-11-20 DIAGNOSIS — E7800 Pure hypercholesterolemia, unspecified: Secondary | ICD-10-CM | POA: Diagnosis not present

## 2018-11-20 DIAGNOSIS — D638 Anemia in other chronic diseases classified elsewhere: Secondary | ICD-10-CM | POA: Diagnosis not present

## 2018-11-20 DIAGNOSIS — Z6841 Body Mass Index (BMI) 40.0 and over, adult: Secondary | ICD-10-CM | POA: Diagnosis not present

## 2018-11-20 DIAGNOSIS — I10 Essential (primary) hypertension: Secondary | ICD-10-CM | POA: Diagnosis not present

## 2018-12-09 DIAGNOSIS — M1612 Unilateral primary osteoarthritis, left hip: Secondary | ICD-10-CM | POA: Diagnosis not present

## 2018-12-09 DIAGNOSIS — M25552 Pain in left hip: Secondary | ICD-10-CM | POA: Diagnosis not present

## 2018-12-12 ENCOUNTER — Other Ambulatory Visit: Payer: Self-pay | Admitting: Family Medicine

## 2018-12-12 DIAGNOSIS — R937 Abnormal findings on diagnostic imaging of other parts of musculoskeletal system: Secondary | ICD-10-CM

## 2018-12-12 DIAGNOSIS — M25552 Pain in left hip: Secondary | ICD-10-CM

## 2018-12-12 DIAGNOSIS — R936 Abnormal findings on diagnostic imaging of limbs: Secondary | ICD-10-CM

## 2018-12-18 ENCOUNTER — Encounter
Admission: RE | Admit: 2018-12-18 | Discharge: 2018-12-18 | Disposition: A | Discharge status: Home / Self Care | Payer: Medicare HMO | Source: Ambulatory Visit | Attending: Family Medicine | Admitting: Family Medicine

## 2018-12-18 DIAGNOSIS — M25552 Pain in left hip: Secondary | ICD-10-CM

## 2018-12-18 DIAGNOSIS — R937 Abnormal findings on diagnostic imaging of other parts of musculoskeletal system: Secondary | ICD-10-CM | POA: Diagnosis not present

## 2018-12-18 DIAGNOSIS — R936 Abnormal findings on diagnostic imaging of limbs: Secondary | ICD-10-CM

## 2018-12-18 MED ORDER — TECHNETIUM TC 99M MEDRONATE IV KIT
20.0000 | PACK | Freq: Once | INTRAVENOUS | Status: AC | PRN
  Administered 2018-12-18: 21.209 via INTRAVENOUS

## 2018-12-24 DIAGNOSIS — M7062 Trochanteric bursitis, left hip: Secondary | ICD-10-CM | POA: Diagnosis not present

## 2018-12-24 DIAGNOSIS — M1612 Unilateral primary osteoarthritis, left hip: Secondary | ICD-10-CM | POA: Diagnosis not present

## 2018-12-30 DIAGNOSIS — M25552 Pain in left hip: Secondary | ICD-10-CM | POA: Diagnosis not present

## 2018-12-30 DIAGNOSIS — M1612 Unilateral primary osteoarthritis, left hip: Secondary | ICD-10-CM | POA: Diagnosis not present

## 2019-01-02 DIAGNOSIS — M25552 Pain in left hip: Secondary | ICD-10-CM | POA: Diagnosis not present

## 2019-01-02 DIAGNOSIS — R937 Abnormal findings on diagnostic imaging of other parts of musculoskeletal system: Secondary | ICD-10-CM | POA: Diagnosis not present

## 2019-01-02 DIAGNOSIS — M79605 Pain in left leg: Secondary | ICD-10-CM | POA: Diagnosis not present

## 2019-01-23 DIAGNOSIS — G8929 Other chronic pain: Secondary | ICD-10-CM | POA: Diagnosis not present

## 2019-01-23 DIAGNOSIS — M5442 Lumbago with sciatica, left side: Secondary | ICD-10-CM | POA: Diagnosis not present

## 2019-01-23 DIAGNOSIS — M79605 Pain in left leg: Secondary | ICD-10-CM | POA: Diagnosis not present

## 2019-01-23 DIAGNOSIS — M25552 Pain in left hip: Secondary | ICD-10-CM | POA: Diagnosis not present

## 2019-01-23 DIAGNOSIS — M545 Low back pain: Secondary | ICD-10-CM | POA: Diagnosis not present

## 2019-01-27 ENCOUNTER — Other Ambulatory Visit: Payer: Self-pay | Admitting: Physician Assistant

## 2019-01-27 ENCOUNTER — Other Ambulatory Visit (HOSPITAL_COMMUNITY): Payer: Self-pay | Admitting: Physician Assistant

## 2019-01-27 DIAGNOSIS — G8929 Other chronic pain: Secondary | ICD-10-CM

## 2019-01-27 DIAGNOSIS — M5442 Lumbago with sciatica, left side: Principal | ICD-10-CM

## 2019-02-04 ENCOUNTER — Ambulatory Visit
Admission: RE | Admit: 2019-02-04 | Discharge: 2019-02-04 | Disposition: A | Discharge status: Home / Self Care | Payer: Medicare HMO | Source: Ambulatory Visit | Attending: Physician Assistant | Admitting: Physician Assistant

## 2019-02-04 ENCOUNTER — Other Ambulatory Visit: Payer: Self-pay

## 2019-02-04 DIAGNOSIS — G8929 Other chronic pain: Secondary | ICD-10-CM | POA: Diagnosis present

## 2019-02-04 DIAGNOSIS — M5442 Lumbago with sciatica, left side: Secondary | ICD-10-CM | POA: Diagnosis present

## 2020-11-08 ENCOUNTER — Other Ambulatory Visit: Payer: Self-pay

## 2020-11-08 ENCOUNTER — Emergency Department
Admission: EM | Admit: 2020-11-08 | Discharge: 2020-11-08 | Disposition: A | Discharge status: Home / Self Care | Payer: Medicare HMO | Attending: Emergency Medicine | Admitting: Emergency Medicine

## 2020-11-08 ENCOUNTER — Emergency Department: Payer: Medicare HMO

## 2020-11-08 DIAGNOSIS — M25532 Pain in left wrist: Secondary | ICD-10-CM | POA: Insufficient documentation

## 2020-11-08 DIAGNOSIS — I129 Hypertensive chronic kidney disease with stage 1 through stage 4 chronic kidney disease, or unspecified chronic kidney disease: Secondary | ICD-10-CM | POA: Insufficient documentation

## 2020-11-08 DIAGNOSIS — M79642 Pain in left hand: Secondary | ICD-10-CM | POA: Insufficient documentation

## 2020-11-08 DIAGNOSIS — Z7982 Long term (current) use of aspirin: Secondary | ICD-10-CM | POA: Insufficient documentation

## 2020-11-08 DIAGNOSIS — Z79899 Other long term (current) drug therapy: Secondary | ICD-10-CM | POA: Insufficient documentation

## 2020-11-08 DIAGNOSIS — E1122 Type 2 diabetes mellitus with diabetic chronic kidney disease: Secondary | ICD-10-CM | POA: Diagnosis not present

## 2020-11-08 DIAGNOSIS — Z87891 Personal history of nicotine dependence: Secondary | ICD-10-CM | POA: Diagnosis not present

## 2020-11-08 DIAGNOSIS — Z7984 Long term (current) use of oral hypoglycemic drugs: Secondary | ICD-10-CM | POA: Insufficient documentation

## 2020-11-08 DIAGNOSIS — N183 Chronic kidney disease, stage 3 unspecified: Secondary | ICD-10-CM | POA: Insufficient documentation

## 2020-11-08 DIAGNOSIS — S62317A Displaced fracture of base of fifth metacarpal bone. left hand, initial encounter for closed fracture: Secondary | ICD-10-CM

## 2020-11-08 DIAGNOSIS — W01198A Fall on same level from slipping, tripping and stumbling with subsequent striking against other object, initial encounter: Secondary | ICD-10-CM | POA: Insufficient documentation

## 2020-11-08 DIAGNOSIS — S0990XA Unspecified injury of head, initial encounter: Secondary | ICD-10-CM | POA: Diagnosis not present

## 2020-11-08 DIAGNOSIS — S6992XA Unspecified injury of left wrist, hand and finger(s), initial encounter: Secondary | ICD-10-CM | POA: Diagnosis present

## 2020-11-08 MED ORDER — TRAMADOL HCL 50 MG PO TABS: 50.0000 mg | ORAL_TABLET | Freq: Two times a day (BID) | ORAL | 0 refills | Status: DC | PRN

## 2020-11-08 MED ORDER — TRAMADOL HCL 50 MG PO TABS
50.0000 mg | ORAL_TABLET | Freq: Once | ORAL | Status: AC
  Administered 2020-11-08: 50 mg via ORAL
  Filled 2020-11-08: qty 1

## 2020-11-08 NOTE — ED Provider Notes (Signed)
[facility] Emergency Department Provider Note   ____________________________________________   Event Date/Time   First MD Initiated Contact with Patient 11/08/20 1434     (approximate)  I have reviewed the triage vital signs and the nursing notes.   HISTORY  Chief Complaint Wrist Pain and Hand Pain    HPI Olivia Chambers is a 85 y.o. female patient complaining of left hand/wrist pain secondary to a fall which occurred 3 days ago.  Patient she tripped over a piece of landscaping barrier and fell landing on her left side.  Patient also states she hit the side of her head.  Patient denies LOC.  Patient awoke today with increasing left hand pain.  Patient rates the pain as a 9/10.  Patient scribed pain is "achy".  No palliative measures for complaint.         Past Medical History:  Diagnosis Date  . Allergy   . CKD (chronic kidney disease) stage 3, GFR 30-59 ml/min   . Dermatitis   . Diabetes mellitus without complication ([place])   . GERD (gastroesophageal reflux disease)   . Hyperlipidemia   . Hypertension   . Obesity   . Osteoarthritis   . Vitamin D deficiency     Patient Active Problem List   Diagnosis Date Noted  . Diabetes mellitus with diabetic retinopathy 09/23/2015  . Hearing loss sensory, bilateral 09/10/2015  . Vertigo 08/30/2015  . Tinnitus 08/30/2015  . Systolic ejection murmur 14/97/0263  . Cerebral vascular disease 08/23/2015  . Retinopathy, background, nonproliferative, mild 07/05/2015  . Benign essential HTN 05/22/2015  . Chronic kidney disease (CKD), stage III (moderate) ([place]) 05/22/2015  . Dyslipidemia 05/22/2015  . Obesity, diabetes, and hypertension syndrome ([place]) 05/22/2015  . Osteoarthrosis, unspecified whether generalized or localized, involving lower leg 05/22/2015  . Allergic rhinitis 05/22/2015  . Diabetes mellitus with renal manifestation ([place]) 05/22/2015  . Vitamin D deficiency 05/22/2015  . Gastro-esophageal  reflux disease without esophagitis 04/28/2007    Past Surgical History:  Procedure Laterality Date  . CATARACT EXTRACTION Left 11/16/2014   Right- 12-04-2014    Prior to Admission medications   Medication Sig Start Date End Date Taking? Authorizing Provider  Alcohol Swabs (B-D SINGLE USE SWABS REGULAR) PADS  05/07/15   [provider]  amLODipine (NORVASC) 5 MG tablet Take 1 tablet (5 mg total) by mouth daily. 11/11/15   Steele Sizer, MD  aspirin 81 MG chewable tablet Chew 1 tablet by mouth daily. 08/28/07   [provider]  atorvastatin (LIPITOR) 40 MG tablet Take 1 tablet (40 mg total) by mouth daily. 10/25/15   Steele Sizer, MD  azelastine (ASTELIN) 0.1 % nasal spray  09/22/15   [provider]  Blood Glucose Monitoring Suppl (TRUE METRIX METER) W/DEVICE KIT 1 Units by Does not apply route daily. 09/08/15   Steele Sizer, MD  Cholecalciferol (VITAMIN D) 2000 UNITS tablet Take 1 tablet by mouth daily. 10/09/11   [provider]  fluticasone Asencion Islam) 50 MCG/ACT nasal spray  09/21/15   [provider]  glipiZIDE (GLUCOTROL XL) 2.5 MG 24 hr tablet TAKE 1 TABLET EVERY DAY 01/06/16   Ancil Boozer, Drue Stager, MD  glucose blood (TRUE METRIX BLOOD GLUCOSE TEST) test strip Twice daily 09/08/15   Steele Sizer, MD  hydrochlorothiazide (HYDRODIURIL) 25 MG tablet Take 1 tablet (25 mg total) by mouth daily. 11/11/15   Steele Sizer, MD  lisinopril (PRINIVIL,ZESTRIL) 40 MG tablet Take 1 tablet (40 mg total) by mouth every evening. 05/24/15  Steele Sizer, MD  meclizine (ANTIVERT) 12.5 MG tablet Take 1 tablet (12.5 mg total) by mouth 3 (three) times daily as needed for dizziness or nausea. 08/23/15   Daymon Larsen, MD  metFORMIN (GLUCOPHAGE) 500 MG tablet TAKE 1 TABLET TWICE DAILY 05/27/16   Steele Sizer, MD  traMADol (ULTRAM) 50 MG tablet Take 1 tablet (50 mg total) by mouth every 12 (twelve) hours as needed. 11/08/20   Sable Feil, PA-C  TRUEPLUS  LANCETS 33G MISC  09/09/15   [provider]    Allergies Banana, Pioglitazone, Sitagliptin, and Shrimp [shellfish allergy]  Family History  Problem Relation Age of Onset  . Hypertension Mother   . Diabetes Mother   . Cancer Father        Prostate  . Hypertension Father   . Diabetes Sister   . Dementia Sister   . Diabetes Brother     Social History Social History   Tobacco Use  . Smoking status: Former Smoker    Packs/day: 0.50    Types: Cigarettes    Quit date: 11/26/2002    Years since quitting: 17.9  . Smokeless tobacco: Never Used  Substance Use Topics  . Alcohol use: No    Alcohol/week: 0.0 standard drinks  . Drug use: No    Review of Systems Constitutional: No fever/chills Eyes: No visual changes. ENT: No sore throat. Cardiovascular: Denies chest pain. Respiratory: Denies shortness of breath. Gastrointestinal: No abdominal pain.  No nausea, no vomiting.  No diarrhea.  No constipation. Genitourinary: Negative for dysuria. Musculoskeletal: Negative for back pain. Skin: Negative for rash. Neurological: Negative for headaches, focal weakness or numbness. Endocrine:  Chronic kidney disease, diabetes, hyperlipidemia, and hypertension. Allergic/Immunilogical: Bananas, Pioglitazone, Sitagliptin, and shrimp. ____________________________________________   PHYSICAL EXAM:  VITAL SIGNS: ED Triage Vitals  Enc Vitals Group     BP 11/08/20 1159 130/60     Pulse Rate 11/08/20 1159 64     Resp --      Temp 11/08/20 1159 98.6 F (37 C)     Temp Source 11/08/20 1159 Oral     SpO2 11/08/20 1159 99 %     Weight 11/08/20 1201 214 lb (97.1 kg)     Height 11/08/20 1201 5' (1.524 m)     Head Circumference --      Peak Flow --      Pain Score 11/08/20 1200 9     Pain Loc --      Pain Edu? --      Excl. in [place]? --     Constitutional: Alert and oriented. Well appearing and in no acute distress. Eyes: Conjunctivae are normal. PERRL. EOMI. Head:  Atraumatic. Nose: No congestion/rhinnorhea. Mouth/Throat: Mucous membranes are moist.  Oropharynx non-erythematous. Neck: No cervical spine tenderness to palpation. Hematological/Lymphatic/Immunilogical: No cervical lymphadenopathy. Cardiovascular: Normal rate, regular rhythm. Grossly normal heart sounds.  Good peripheral circulation. Respiratory: Normal respiratory effort.  No retractions. Lungs CTAB. Gastrointestinal: Soft and nontender. No distention. No abdominal bruits. No CVA tenderness. Genitourinary: Deferred Musculoskeletal: No gross deformity to the left hand and wrist.  Patient has moderate guarding palpation at the proximal left fifth proximal metacarpal for. Neurologic:  Normal speech and language. No gross focal neurologic deficits are appreciated. No gait instability. Skin:  Skin is warm, dry and intact. No rash noted. Psychiatric: Mood and affect are normal. Speech and behavior are normal.  ____________________________________________   LABS (all labs ordered are listed, but only abnormal results are displayed)  Labs Reviewed - No  data to display ____________________________________________  EKG   ____________________________________________  RADIOLOGY I, Sable Feil, personally viewed and evaluated these images (plain radiographs) as part of my medical decision making, as well as reviewing the written report by the radiologist.  ED MD interpretation: Patient has slight displaced fracture of the proximal left fifth metacarpal..  Official radiology report(s): DG Wrist Complete Left  Result Date: 11/08/2020 CLINICAL DATA:  Pain following fall EXAM: LEFT WRIST - COMPLETE 3+ VIEW COMPARISON:  None. FINDINGS: Frontal, oblique, lateral, and ulnar deviation scaphoid images were obtained. There is a fracture of the proximal metaphysis of the fifth metacarpal with impaction at the fracture site. No other appreciable fracture. No dislocation. There is narrowing of the  scaphotrapezial and first carpal-metacarpal joints. Bones are osteoporotic. IMPRESSION: Impacted fracture proximal fifth metacarpal. No other fracture. No dislocation. Underlying osteoarthritic change laterally. Underlying osteoporosis. Electronically Signed   By: Lowella Grip III M.D.   On: 11/08/2020 12:34   CT Head Wo Contrast  Result Date: 11/08/2020 CLINICAL DATA:  Poly trauma, critical, head/cervical spine injury suspected. Additional history provided: Fall on Sunday. EXAM: CT HEAD WITHOUT CONTRAST CT CERVICAL SPINE WITHOUT CONTRAST TECHNIQUE: Multidetector CT imaging of the head and cervical spine was performed following the standard protocol without intravenous contrast. Multiplanar CT image reconstructions of the cervical spine were also generated. COMPARISON:  CT head/cervical spine 03/30/2017. FINDINGS: CT HEAD FINDINGS Brain: Moderate cerebral atrophy.  Comparatively mild cerebellar atrophy. Mild ill-defined hypoattenuation within the cerebral white matter is nonspecific, but compatible with chronic small vessel ischemic disease. There is no acute intracranial hemorrhage. No demarcated cortical infarct. No extra-axial fluid collection. No evidence of intracranial mass. No midline shift. Vascular: No hyperdense vessel.  Atherosclerotic calcifications. Skull: No calvarial fracture. Sinuses/Orbits: Visualized orbits show no acute finding. Chronic medially displaced fracture deformity of the right lamina papyracea. No significant paranasal sinus disease. Other: Anterior subluxation of the left mandibular condyle. CT CERVICAL SPINE FINDINGS Alignment: Mild nonspecific reversal of the expected cervical lordosis. Trace C2-C3 grade 1 anterolisthesis. Skull base and vertebrae: The basion-dental and atlanto-dental intervals are maintained.No evidence of acute fracture to the cervical spine. Soft tissues and spinal canal: No prevertebral fluid or swelling. No visible canal hematoma. Disc levels: Cervical  spondylosis with multilevel disc space narrowing, disc bulges, uncovertebral hypertrophy and facet arthrosis. Multilevel ventral osteophytes most prominent at C5-C6 and C6-C7. Disc space narrowing is advanced at C3-C4, C4-C5, C5-C6 and C6-C7. Upper chest: No consolidation within the imaged lung apices. No visible pneumothorax. IMPRESSION: CT head: 1. No evidence of acute intracranial abnormality. 2. Moderate cerebral atrophy with mild chronic small vessel ischemic disease. 3. Redemonstrated chronic right lamina papyracea fracture deformity. 4. Anterior subluxation of the left mandibular condyle, which may be related to patient positioning at the time of the examination. Correlate with physical exam to exclude TMJ dislocation. CT cervical spine: 1. No evidence of acute fracture to the cervical spine. 2. Mild nonspecific reversal of the expected cervical lordosis. 3. Mild chronic C2-C3 grade 1 anterolisthesis. 4. Cervical spondylosis as described. Electronically Signed   By: Kellie Simmering DO   On: 11/08/2020 14:36   CT Cervical Spine Wo Contrast  Result Date: 11/08/2020 CLINICAL DATA:  Poly trauma, critical, head/cervical spine injury suspected. Additional history provided: Fall on Sunday. EXAM: CT HEAD WITHOUT CONTRAST CT CERVICAL SPINE WITHOUT CONTRAST TECHNIQUE: Multidetector CT imaging of the head and cervical spine was performed following the standard protocol without intravenous contrast. Multiplanar CT image reconstructions of the cervical spine  were also generated. COMPARISON:  CT head/cervical spine 03/30/2017. FINDINGS: CT HEAD FINDINGS Brain: Moderate cerebral atrophy.  Comparatively mild cerebellar atrophy. Mild ill-defined hypoattenuation within the cerebral white matter is nonspecific, but compatible with chronic small vessel ischemic disease. There is no acute intracranial hemorrhage. No demarcated cortical infarct. No extra-axial fluid collection. No evidence of intracranial mass. No midline shift.  Vascular: No hyperdense vessel.  Atherosclerotic calcifications. Skull: No calvarial fracture. Sinuses/Orbits: Visualized orbits show no acute finding. Chronic medially displaced fracture deformity of the right lamina papyracea. No significant paranasal sinus disease. Other: Anterior subluxation of the left mandibular condyle. CT CERVICAL SPINE FINDINGS Alignment: Mild nonspecific reversal of the expected cervical lordosis. Trace C2-C3 grade 1 anterolisthesis. Skull base and vertebrae: The basion-dental and atlanto-dental intervals are maintained.No evidence of acute fracture to the cervical spine. Soft tissues and spinal canal: No prevertebral fluid or swelling. No visible canal hematoma. Disc levels: Cervical spondylosis with multilevel disc space narrowing, disc bulges, uncovertebral hypertrophy and facet arthrosis. Multilevel ventral osteophytes most prominent at C5-C6 and C6-C7. Disc space narrowing is advanced at C3-C4, C4-C5, C5-C6 and C6-C7. Upper chest: No consolidation within the imaged lung apices. No visible pneumothorax. IMPRESSION: CT head: 1. No evidence of acute intracranial abnormality. 2. Moderate cerebral atrophy with mild chronic small vessel ischemic disease. 3. Redemonstrated chronic right lamina papyracea fracture deformity. 4. Anterior subluxation of the left mandibular condyle, which may be related to patient positioning at the time of the examination. Correlate with physical exam to exclude TMJ dislocation. CT cervical spine: 1. No evidence of acute fracture to the cervical spine. 2. Mild nonspecific reversal of the expected cervical lordosis. 3. Mild chronic C2-C3 grade 1 anterolisthesis. 4. Cervical spondylosis as described. Electronically Signed   By: Kellie Simmering DO   On: 11/08/2020 14:36   DG Hand Complete Left  Result Date: 11/08/2020 CLINICAL DATA:  Fall. EXAM: LEFT HAND - COMPLETE 3+ VIEW COMPARISON:  None. FINDINGS: The bones appear diffusely osteopenic. There is an acute  fracture involving the base of the fifth metacarpal bone. There is mild lateral displacement of the distal fracture fragments. No additional fracture or dislocation. There is narrowing of the distal radioulnar and radiocarpal joints. Mild diffuse D IP joint space narrowing and marginal spur formation. IMPRESSION: 1. Acute fracture involves the base of the fifth metacarpal bone. 2. Osteopenia. 3. Osteoarthritis. Electronically Signed   By: Kerby Moors M.D.   On: 11/08/2020 12:36    ____________________________________________   PROCEDURES  Procedure(s) performed (including Critical Care):  .Splint Application  Date/Time: 11/08/2020 3:21 PM Performed by: Sable Feil, PA-C Authorized by: Sable Feil, PA-C   Consent:    Consent obtained:  Verbal   Consent given by:  Patient   Risks discussed:  Numbness, pain and swelling Universal protocol:    Procedure explained and questions answered to patient or proxy's satisfaction: yes     Relevant documents present and verified: yes     Test results available: yes     Imaging studies available: yes     Required blood products, implants, devices, and special equipment available: no     Site/side marked: no     Immediately prior to procedure a time out was called: yes     Patient identity confirmed:  Verbally with patient Pre-procedure details:    Distal neurologic exam:  Normal Procedure details:    Location:  Finger   Finger location:  L small finger   Splint type:  Ulnar gutter  Attestation: Splint applied and adjusted personally by me   Post-procedure details:    Distal neurologic exam:  Normal   Procedure completion:  Tolerated well, no immediate complications     ____________________________________________   INITIAL IMPRESSION / ASSESSMENT AND PLAN / ED COURSE  As part of my medical decision making, I reviewed the following data within the [facility]         Patient presents with left hand and  wrist pain secondary to fall 3 days ago.  Discussed no acute findings on CT of the head and neck.  Patient has a slightly displaced fracture of the proximal fifth metatarsal.  Patient placed in ulnar gutter splint and advised to follow-up orthopedic for definitive evaluation and treatment.      ____________________________________________   FINAL CLINICAL IMPRESSION(S) / ED DIAGNOSES  Final diagnoses:  Displaced fracture of base of fifth metacarpal bone, left hand, initial encounter for closed fracture     ED Discharge Orders         Ordered    traMADol (ULTRAM) 50 MG tablet  Every 12 hours PRN        11/08/20 1510          *Please note:  Olivia Chambers was evaluated in Emergency Department on 11/08/2020 for the symptoms described in the history of present illness. She was evaluated in the context of the global COVID-19 pandemic, which necessitated consideration that the patient might be at risk for infection with the SARS-CoV-2 virus that causes COVID-19. Institutional protocols and algorithms that pertain to the evaluation of patients at risk for COVID-19 are in a state of rapid change based on information released by regulatory bodies including the CDC and federal and state organizations. These policies and algorithms were followed during the patient's care in the ED.  Some ED evaluations and interventions may be delayed as a result of limited staffing during and the pandemic.*   Note:  This document was prepared using Dragon voice recognition software and may include unintentional dictation errors.    Sable Feil, PA-C 11/08/20 1524    Duffy Bruce, MD 11/08/20 [phone]

## 2020-11-08 NOTE — ED Triage Notes (Signed)
Pt tripped over a piece of landscape barrier causing her to fall on Sunday, pt landed on her left side injuring her left wrist ,left hand, and states the left side of her head. Pt woke up today c/o left hand and wrist hurting worse.

## 2020-11-08 NOTE — Discharge Instructions (Addendum)
Follow discharge care instructions and wear splint until evaluation by orthopedics.

## 2020-11-08 NOTE — ED Notes (Signed)
Pt making multiple attempts to leave ER room to see daughter, this RN attempted to contact [place] multiple times with no success. Pt agreeable to stay in wheelchair in room to wait for daughter. Call light given to patient.

## 2020-11-08 NOTE — ED Notes (Signed)
Patient transported to CT 

## 2020-11-08 NOTE — ED Notes (Signed)
Signature pad not working, pt and daughter verbalize understanding of d/c instructions, denies questions or concerns

## 2021-07-28 ENCOUNTER — Emergency Department: Payer: Medicare HMO

## 2021-07-28 ENCOUNTER — Other Ambulatory Visit: Payer: Self-pay

## 2021-07-28 DIAGNOSIS — Z79899 Other long term (current) drug therapy: Secondary | ICD-10-CM | POA: Diagnosis not present

## 2021-07-28 DIAGNOSIS — Z87891 Personal history of nicotine dependence: Secondary | ICD-10-CM | POA: Diagnosis not present

## 2021-07-28 DIAGNOSIS — Z7982 Long term (current) use of aspirin: Secondary | ICD-10-CM | POA: Insufficient documentation

## 2021-07-28 DIAGNOSIS — E11319 Type 2 diabetes mellitus with unspecified diabetic retinopathy without macular edema: Secondary | ICD-10-CM | POA: Diagnosis not present

## 2021-07-28 DIAGNOSIS — E1129 Type 2 diabetes mellitus with other diabetic kidney complication: Secondary | ICD-10-CM | POA: Insufficient documentation

## 2021-07-28 DIAGNOSIS — N183 Chronic kidney disease, stage 3 unspecified: Secondary | ICD-10-CM | POA: Insufficient documentation

## 2021-07-28 DIAGNOSIS — I129 Hypertensive chronic kidney disease with stage 1 through stage 4 chronic kidney disease, or unspecified chronic kidney disease: Secondary | ICD-10-CM | POA: Diagnosis not present

## 2021-07-28 DIAGNOSIS — R600 Localized edema: Secondary | ICD-10-CM | POA: Diagnosis present

## 2021-07-28 DIAGNOSIS — Z7984 Long term (current) use of oral hypoglycemic drugs: Secondary | ICD-10-CM | POA: Insufficient documentation

## 2021-07-28 LAB — BASIC METABOLIC PANEL
Anion gap: 8 (ref 5–15)
BUN: 29 mg/dL — ABNORMAL HIGH (ref 8–23)
CO2: 26 mmol/L (ref 22–32)
Calcium: 9 mg/dL (ref 8.9–10.3)
Chloride: 103 mmol/L (ref 98–111)
Creatinine, Ser: 1.39 mg/dL — ABNORMAL HIGH (ref 0.44–1.00)
GFR, Estimated: 36 mL/min — ABNORMAL LOW (ref >60)
Glucose, Bld: 146 mg/dL — ABNORMAL HIGH (ref 70–99)
Potassium: 4.3 mmol/L (ref 3.5–5.1)
Sodium: 137 mmol/L (ref 135–145)

## 2021-07-28 LAB — CBC
HCT: 32.4 % — ABNORMAL LOW (ref 36.0–46.0)
Hemoglobin: 10.5 g/dL — ABNORMAL LOW (ref 12.0–15.0)
MCH: 28.8 pg (ref 26.0–34.0)
MCHC: 32.4 g/dL (ref 30.0–36.0)
MCV: 88.8 fL (ref 80.0–100.0)
Platelets: 152 10*3/uL (ref 150–400)
RBC: 3.65 MIL/uL — ABNORMAL LOW (ref 3.87–5.11)
RDW: 13.5 % (ref 11.5–15.5)
WBC: 2.4 10*3/uL — ABNORMAL LOW (ref 4.0–10.5)
nRBC: 0 % (ref 0.0–0.2)

## 2021-07-28 LAB — BRAIN NATRIURETIC PEPTIDE: B Natriuretic Peptide: 31.8 pg/mL (ref 0.0–100.0)

## 2021-07-28 LAB — TROPONIN I (HIGH SENSITIVITY): Troponin I (High Sensitivity): 10 ng/L (ref <18)

## 2021-07-28 NOTE — ED Triage Notes (Addendum)
C/o bilat leg swelling x1week, increasing in severity. Pt. Was seen for same 2 weeks ago. EMS states pt. Was on antibiotic 2 weeks ago. Pt. Denies any dizziness, SOB, or CP.

## 2021-07-29 ENCOUNTER — Emergency Department: Payer: Medicare HMO

## 2021-07-29 ENCOUNTER — Emergency Department
Admission: EM | Admit: 2021-07-29 | Discharge: 2021-07-29 | Disposition: A | Discharge status: Home / Self Care | Payer: Medicare HMO | Attending: Emergency Medicine | Admitting: Emergency Medicine

## 2021-07-29 DIAGNOSIS — R609 Edema, unspecified: Secondary | ICD-10-CM

## 2021-07-29 LAB — TROPONIN I (HIGH SENSITIVITY): Troponin I (High Sensitivity): 13 ng/L (ref <18)

## 2021-07-29 LAB — CK: Total CK: 144 U/L (ref 38–234)

## 2021-07-29 NOTE — Discharge Instructions (Addendum)
I do recommend you consider being fitted for compression stockings as I recommended from Dr. Burnadette Pop.  Removed please follow-up closely with her cardiology heart failure team as well, I would recommend evaluation with them as a next step in determining the cause of the swelling in your legs.

## 2021-07-29 NOTE — ED Notes (Signed)
Pt assisted to bathroom, pt missed hat did not collect urine sample. Family notified that they can wait with her, not yet arrived to ED.

## 2021-07-29 NOTE — ED Provider Notes (Signed)
[facility] Emergency Department Provider Note   ____________________________________________   Event Date/Time   First MD Initiated Contact with Patient 07/29/21 0100     (approximate)  I have reviewed the triage vital signs and the nursing notes.   HISTORY  Chief Complaint Leg Swelling (C/o bilat leg swelling x1week, increasing in severity. Pt. Was seen for same 2 weeks ago. EMS states pt. Was on antibiotic 2 weeks ago.)    HPI Olivia Chambers is a 87 y.o. female history of chronic kidney disease diabetes hypertension hyperlipidemia and development of edema  Patient reports that both of her legs were hurting earlier.  She reports that her pain and leg pain has now gone away.  She had wraps and was on an antibiotic for treatment of infection a couple weeks ago, her legs now look a lot better.  She does report that they are swollen but have stayed swollen now for "a long time".  No shortness of breath no trouble breathing does not get short of breath when laying down.  She generally sits in the chair often with her feet down, sometimes has to have them lifted up.  She does not use compression stockings  Denies history of heart problems or congestive heart failure    Past Medical History:  Diagnosis Date   Allergy    CKD (chronic kidney disease) stage 3, GFR 30-59 ml/min    Dermatitis    Diabetes mellitus without complication (HCC)    GERD (gastroesophageal reflux disease)    Hyperlipidemia    Hypertension    Obesity    Osteoarthritis    Vitamin D deficiency     Patient Active Problem List   Diagnosis Date Noted   Diabetes mellitus with diabetic retinopathy 09/23/2015   Hearing loss sensory, bilateral 09/10/2015   Vertigo 08/30/2015   Tinnitus 43/15/4008   Systolic ejection murmur 67/61/9509   Cerebral vascular disease 08/23/2015   Retinopathy, background, nonproliferative, mild 07/05/2015   Benign essential HTN 05/22/2015   Chronic kidney  disease (CKD), stage III (moderate) ([place]) 05/22/2015   Dyslipidemia 05/22/2015   Obesity, diabetes, and hypertension syndrome ([place]) 05/22/2015   Osteoarthrosis, unspecified whether generalized or localized, involving lower leg 05/22/2015   Allergic rhinitis 05/22/2015   Diabetes mellitus with renal manifestation ([place]) 05/22/2015   Vitamin D deficiency 05/22/2015   Gastro-esophageal reflux disease without esophagitis 04/28/2007    Past Surgical History:  Procedure Laterality Date   CATARACT EXTRACTION Left 11/16/2014   Right- 12-04-2014    Prior to Admission medications   Medication Sig Start Date End Date Taking? Authorizing Provider  Alcohol Swabs (B-D SINGLE USE SWABS REGULAR) PADS  05/07/15   [provider]  amLODipine (NORVASC) 5 MG tablet Take 1 tablet (5 mg total) by mouth daily. 11/11/15   Steele Sizer, MD  aspirin 81 MG chewable tablet Chew 1 tablet by mouth daily. 08/28/07   [provider]  atorvastatin (LIPITOR) 40 MG tablet Take 1 tablet (40 mg total) by mouth daily. 10/25/15   Steele Sizer, MD  azelastine (ASTELIN) 0.1 % nasal spray  09/22/15   [provider]  Blood Glucose Monitoring Suppl (TRUE METRIX METER) W/DEVICE KIT 1 Units by Does not apply route daily. 09/08/15   Steele Sizer, MD  Cholecalciferol (VITAMIN D) 2000 UNITS tablet Take 1 tablet by mouth daily. 10/09/11   [provider]  fluticasone Asencion Islam) 50 MCG/ACT nasal spray  09/21/15   [provider]  glipiZIDE (GLUCOTROL XL) 2.5 MG  24 hr tablet TAKE 1 TABLET EVERY DAY 01/06/16   Ancil Boozer, Drue Stager, MD  glucose blood (TRUE METRIX BLOOD GLUCOSE TEST) test strip Twice daily 09/08/15   Steele Sizer, MD  hydrochlorothiazide (HYDRODIURIL) 25 MG tablet Take 1 tablet (25 mg total) by mouth daily. 11/11/15   Steele Sizer, MD  lisinopril (PRINIVIL,ZESTRIL) 40 MG tablet Take 1 tablet (40 mg total) by mouth every evening. 05/24/15   Steele Sizer, MD  meclizine  (ANTIVERT) 12.5 MG tablet Take 1 tablet (12.5 mg total) by mouth 3 (three) times daily as needed for dizziness or nausea. 08/23/15   Daymon Larsen, MD  metFORMIN (GLUCOPHAGE) 500 MG tablet TAKE 1 TABLET TWICE DAILY 05/27/16   Steele Sizer, MD  traMADol (ULTRAM) 50 MG tablet Take 1 tablet (50 mg total) by mouth every 12 (twelve) hours as needed. 11/08/20   Sable Feil, PA-C  TRUEPLUS LANCETS 33G MISC  09/09/15   [provider]    Allergies Banana, Pioglitazone, Sitagliptin, and Shrimp [shellfish allergy]  Family History  Problem Relation Age of Onset   Hypertension Mother    Diabetes Mother    Cancer Father        Prostate   Hypertension Father    Diabetes Sister    Dementia Sister    Diabetes Brother     Social History Social History   Tobacco Use   Smoking status: Former    Packs/day: 0.50    Types: Cigarettes    Quit date: 11/26/2002    Years since quitting: 18.6   Smokeless tobacco: Never  Substance Use Topics   Alcohol use: No    Alcohol/week: 0.0 standard drinks   Drug use: No    Review of Systems Constitutional: No fever/chills but reports she thinks a couple weeks ago when she had the infection in her legs that she was having fevers then Eyes: No visual changes. ENT: No sore throat. Cardiovascular: Denies chest pain. Respiratory: Denies shortness of breath. Gastrointestinal: No abdominal pain.   Genitourinary: Negative for dysuria. Musculoskeletal: Negative for back pain.  Both lower legs calves were painful earlier today that has now resolved Skin: Negative for rash. Neurological: Negative for headaches, areas of focal weakness or numbness.    ____________________________________________   PHYSICAL EXAM:  VITAL SIGNS: ED Triage Vitals  Enc Vitals Group     BP 07/28/21 2125 (!) 146/59     Pulse Rate 07/28/21 2125 88     Resp 07/28/21 2125 18     Temp 07/28/21 2125 98.4 F (36.9 C)     Temp Source 07/28/21 2125 Oral     SpO2  07/28/21 2124 98 %     Weight --      Height --      Head Circumference --      Peak Flow --      Pain Score 07/28/21 2126 0     Pain Loc --      Pain Edu? --      Excl. in [place]? --     Constitutional: Alert and oriented. Well appearing and in no acute distress.  She is very pleasant.  Alert she is well oriented Eyes: Conjunctivae are normal. Head: Atraumatic. Nose: No congestion/rhinnorhea. Mouth/Throat: Mucous membranes are moist. Neck: No stridor.  Cardiovascular: Normal rate, regular rhythm. Grossly normal heart sounds.  Good peripheral circulation. Respiratory: Normal respiratory effort.  No retractions. Lungs CTAB. Gastrointestinal: Soft and nontender. No distention. Musculoskeletal: No lower extremity tenderness nor edema.  Palpable dorsalis pedis  pulses with normal capillary refill lower extremities bilateral.  No deformities.  Lower extremities atraumatic.  Patient able to move lower extremities with 4+ strength bilaterally.  No back pain.  Examined both front and back of each leg with assistance of nurse as well, no evidence of acute lesions skin breakdown tears or injury.  Some venous stasis changes and healing well scarring noted over the anterior shins bilaterally. Neurologic:  Normal speech and language. No gross focal neurologic deficits are appreciated.  Skin:  Skin is warm, dry and intact. No rash noted. Psychiatric: Mood and affect are normal. Speech and behavior are normal.  ____________________________________________   LABS (all labs ordered are listed, but only abnormal results are displayed)  Labs Reviewed  BASIC METABOLIC PANEL - Abnormal; Notable for the following components:      Result Value   Glucose, Bld 146 (*)    BUN 29 (*)    Creatinine, Ser 1.39 (*)    GFR, Estimated 36 (*)    All other components within normal limits  CBC - Abnormal; Notable for the following components:   WBC 2.4 (*)    RBC 3.65 (*)    Hemoglobin 10.5 (*)    HCT 32.4 (*)     All other components within normal limits  BRAIN NATRIURETIC PEPTIDE  CK  TROPONIN I (HIGH SENSITIVITY)  TROPONIN I (HIGH SENSITIVITY)   ____________________________________________  EKG  EKG is reviewed inter by me at 2132 Heart rate 79 QRS 139 QTc 440 Normal sinus rhythm, right bundle branch block, notable Q-wave in lead III.  No evidence of acute ischemia denoted occasional PACs ____________________________________________  RADIOLOGY  DG Chest 2 View  Result Date: 07/28/2021 CLINICAL DATA:  Bilateral leg swelling x1 week EXAM: CHEST - 2 VIEW COMPARISON:  08/20/2016 FINDINGS: Pulmonary vascular congestion. No frank interstitial edema. No pleural effusion or pneumothorax. The heart is top-normal in size.  Thoracic aortic atherosclerosis. Degenerative changes of the visualized thoracolumbar spine. IMPRESSION: Pulmonary vascular congestion.  No frank interstitial edema. Electronically Signed   By: Julian Hy M.D.   On: 07/28/2021 21:56   US Venous Img Lower Bilateral  Result Date: 07/29/2021 CLINICAL DATA:  Bilateral leg swelling EXAM: BILATERAL LOWER EXTREMITY VENOUS DOPPLER ULTRASOUND TECHNIQUE: Gray-scale sonography with graded compression, as well as color Doppler and duplex ultrasound were performed to evaluate the lower extremity deep venous systems from the level of the common femoral vein and including the common femoral, femoral, profunda femoral, popliteal and calf veins including the posterior tibial, peroneal and gastrocnemius veins when visible. The superficial great saphenous vein was also interrogated. Spectral Doppler was utilized to evaluate flow at rest and with distal augmentation maneuvers in the common femoral, femoral and popliteal veins. COMPARISON:  None. FINDINGS: RIGHT LOWER EXTREMITY Common Femoral Vein: No evidence of thrombus. Normal compressibility, respiratory phasicity and response to augmentation. Saphenofemoral Junction: No evidence of thrombus. Normal  compressibility and flow on color Doppler imaging. Profunda Femoral Vein: No evidence of thrombus. Normal compressibility and flow on color Doppler imaging. Femoral Vein: No evidence of thrombus. Normal compressibility, respiratory phasicity and response to augmentation. Popliteal Vein: Patient refused imaging due to severe pain Calf Veins: Patient refused imaging due to severe pain Superficial Great Saphenous Vein: No evidence of thrombus. Normal compressibility. Venous Reflux:  None. Other Findings:  None. LEFT LOWER EXTREMITY Common Femoral Vein: No evidence of thrombus. Normal compressibility, respiratory phasicity and response to augmentation. Saphenofemoral Junction: No evidence of thrombus. Normal compressibility and flow on color Doppler  imaging. Profunda Femoral Vein: No evidence of thrombus. Normal compressibility and flow on color Doppler imaging. Femoral Vein: No evidence of thrombus. Normal compressibility, respiratory phasicity and response to augmentation. Popliteal Vein: Patient refused imaging due to severe pain Calf Veins: Patient refused imaging due to severe pain Superficial Great Saphenous Vein: No evidence of thrombus. Normal compressibility. Venous Reflux:  None. Other Findings:  None. IMPRESSION: Limited study. Patient refused imaging in the popliteal and calf veins bilaterally due to severe pain. No lower extremity DVT seen above popliteal veins. Electronically Signed   By: Rolm Baptise M.D.   On: 07/29/2021 02:00     Ultrasound lower extremities study limited due to discomfort in the calves.  No evidence of acute DVT denoted however with some limitations chest x-ray reviewed negative for acute infiltrate, pulmonary vascular congestion is noted without edema ____________________________________________   PROCEDURES  Procedure(s) performed: None  Procedures  Critical Care performed: No  ____________________________________________   INITIAL IMPRESSION / ASSESSMENT AND PLAN /  ED COURSE  Pertinent labs & imaging results that were available during my care of the patient were reviewed by me and considered in my medical decision making (see chart for details).  The patient certainly does have moderate edema of her bilateral lower extremities but shows no signs or symptoms of congestive heart failure.  She does not appear to have had a recent echo or cardiac work-up for her peripheral edema, today thankfully she additionally does not show any evidence of infection.  I discussed with her daughter as well and reports the patient was complaining of both of her legs hurting but that symptoms have now resolved.  Daughter reports that since being treated with antibiotic and wraps signs of infection and blisters that were on her legs have all resolved.  She is not been recently ill has been in her normal state of health until this evening when she was seated for a while and started to note that both of her lower legs were hurting her which is now resolved     Review of lab work from [facility] demonstrates chronic leukopenia as well as chronic kidney disease, slight increase in creatinine today but do not see remarkable change.  Do not see evidence of acute kidney injury or kidney failure.  Clinical Course as of 07/29/21 8250  Sat Jul 29, 2021  0146 Discussed with the patient's daughter, she advises EMS was called as patient was complaining of notable discomfort in both of her lower legs.  Swelling however has been stable and appears that signs of infection have resolved.  At this point, patient no longer complaining of any pain or discomfort.  Reassuring clinical exam with palpable dorsalis pedis pulses in lower extremities bilateral without evidence of acute arterial etiology of pain.  No evidence of infection by exam.  Edema present otherwise I suspect perhaps the pain was due to having her legs lowered for a prolonged period of time sitting, as now it seems her pain and discomfort have  resolved. [MQ]  0147 Discussed with the patient and her daughter and I recommended that they have follow-up with our heart failure clinic to have her heart evaluated as a potential cause for her swelling though based on her clinical exam, her normal BNP I suspect venous insufficiency may be the primary driver of edema.  I do not see evidence of obvious hypervolemia to suggest need for IV diuresis at this time especially in the setting of mildly elevated creatinine [MQ]  0216 Patient  reports no pain at rest, but does report pain to palpation over the calves bilaterally.  Both do appear moderately edematous, and I suspect the edema is likely the cause of her pain.  There is no venous cords or congestion to suggest obvious acute DVT.  I will add on a CK to evaluate for rhabdo, though this would seem unlikely.  If this is normal anticipate discharge with recommendation to follow-up with cardiology for which urgent referral has been placed.  Again no evidence of acute arterial disease, palpable lower extremity pulses good well-perfused lower extremities [MQ]    Clinical Course User Index [MQ] Delman Kitten, MD   ----------------------------------------- 3:37 AM on 07/29/2021 ----------------------------------------- Return precautions and treatment recommendations and follow-up discussed with the patient who is agreeable with the plan.  [name] daughter coming to pick her up.  Patient currently resting in no pain or discomfort   ____________________________________________   FINAL CLINICAL IMPRESSION(S) / ED DIAGNOSES  Final diagnoses:  Peripheral edema        Note:  This document was prepared using Dragon voice recognition software and may include unintentional dictation errors       Delman Kitten, MD 07/29/21 [phone]

## 2021-08-06 NOTE — Progress Notes (Deleted)
   Patient ID: Olivia Chambers, female    DOB: 1928-11-14, 87 y.o.   MRN: [id number]  HPI  Ms Olivia Chambers is a 87 y/o female with a history of  No echo for review.   Was in the ED 07/29/21 due to edema. Evaluated and released.   She presents today for her initial with a chief complaint of   Review of Systems    Physical Exam  Assessment & Plan:  1: Chronic heart failure with preserved ejection fraction- - NYHA class - BNP 07/28/21 was 31.8  2: HTN- - BP - saw PCP Olivia Chambers) 07/11/21 - BMP 07/28/21 reviewed and showed sodium 137, potassium 4.3, creatinine 1.39 and GFR 36  3: DM- - saw endocrinology (Olivia Chambers) 04/12/21 - A1c 07/03/21 was 8.3%

## 2021-08-07 ENCOUNTER — Telehealth: Payer: Self-pay

## 2021-08-07 ENCOUNTER — Ambulatory Visit: Payer: Medicare HMO | Admitting: Family

## 2021-08-07 NOTE — Telephone Encounter (Signed)
   Call to patient's daughter Jamesetta So, as that appointment with the heart failure clinic had been canceled.  Daughter states that when her mother has problems like this date related to eating a highly salty meal.  She has gotten her compression stockings and they have had no further problems and are watching the salt/sodium in her diet.  At this time she does not wish to reschedule her appointment.  Relayed to her that we are here if anything changes she voices understanding.  Tresa Endo RN [facility]

## 2021-10-17 ENCOUNTER — Telehealth: Payer: Self-pay | Admitting: Nurse Practitioner

## 2021-10-17 NOTE — Telephone Encounter (Signed)
Palliative Consult was actually scheduled for 10/31/21 @ 2:15 PM.

## 2021-10-17 NOTE — Telephone Encounter (Signed)
Spoke with patient's daughter Margarita Rana, regarding the Palliative referral/services and all questions were answered and she was in agreement with scheduling visit.  I have scheduled a Telephone Palliative Consult for 10/26/21 @ 9:45 AM

## 2021-10-31 ENCOUNTER — Encounter: Payer: Self-pay | Admitting: Nurse Practitioner

## 2021-10-31 ENCOUNTER — Other Ambulatory Visit: Payer: Medicare HMO | Admitting: Nurse Practitioner

## 2021-10-31 ENCOUNTER — Other Ambulatory Visit: Payer: Self-pay

## 2021-10-31 DIAGNOSIS — F02811 Dementia in other diseases classified elsewhere, unspecified severity, with agitation: Secondary | ICD-10-CM

## 2021-10-31 DIAGNOSIS — Z515 Encounter for palliative care: Secondary | ICD-10-CM

## 2021-10-31 DIAGNOSIS — R0602 Shortness of breath: Secondary | ICD-10-CM

## 2021-10-31 NOTE — Progress Notes (Addendum)
**Note Olivia-Identified via Obfuscation** [employer]er[employer] Palliative Care Consult Note Telephone: [phone]  Fax: [phone]   Date of encounter: 10/31/21 5:35 PM PATIENT NAME: Olivia Chambers [place] [id number]   [id number] (home)  DOB: June 03, 1928 MRN: [id number] PRIMARY CARE PROVIDER:    Dion Body, MD,  [place] [address] [phone]  REFERRING PROVIDER:   Dion Body, MD [facility] [place],  [place] [address] [phone]  RESPONSIBLE PARTY:    Contact Information     Name Relation Home Work Mobile   Olivia Chambers Daughter   [phone]   Olivia Chambers Daughter [phone]        Due to the COVID-19 crisis, this visit was done via telemedicine from my office and it was initiated and consent by this patient and or family.  I connected with Olivia Chambers, Olivia Chambers daughter, with Olivia Chambers OR [employer]OX[employer] on 10/31/21 by telephone as video not available to enabled telemedicine application and verified that I am speaking with the correct person using two identifiers.   I discussed the limitations of evaluation and management by telemedicine. The patient expressed understanding and agreed to proceed. Palliative Care was asked to follow this patient by consultation request of  Olivia Body, MD to address advance care planning and complex medical decision making. This is the initial visit.   ASSESSMENT AND PLAN / RECOMMENDATIONS:  Symptom Management/Plan: 1. Advance Care Planning; full code, will revisit  2. Shortness of breath with edema possible CHF Current weight 257 lbs; BMI 50; will follow with in person visit 1 week  3. Agitation, resistance to care secondary to dementia aggressive behaviors; progression of dementia discussed, refusing to take a bath. Home health is set up and scheduled to start. Discussed with Olivia Chambers will see how she does with visits. Discussed at  length about placement, would require Fl2. Olivia Chambers in agreement to further discuss options of placement with PC SW, will do referral. She may or may not be eligible for Medicaid.   4. Goals of Care: Goals include to maximize quality of life and symptom management. Our advance care planning conversation included a discussion about:    The value and importance of advance care planning  Exploration of personal, cultural or spiritual beliefs that might influence medical decisions  Exploration of goals of care in the event of a sudden injury or illness  Identification and preparation of a healthcare agent  Review and updating or creation of an advance directive document.  5. Palliative care encounter; Palliative care encounter; Palliative medicine team will continue to support patient, patient's family, and medical team. Visit consisted of counseling and education dealing with the complex and emotionally intense issues of symptom management and palliative care in the setting of serious and potentially life-threatening illness  6. f/u 1 week for ongoing monitoring chronic disease progression, ongoing discussions complex medical decision making  Follow up Palliative Care Visit: Palliative care will continue to follow for complex medical decision making, advance care planning, and clarification of goals. Return 1 weeks or prn.  I spent 61 minutes providing this consultation. More than 50% of the time in this consultation was spent in counseling and care coordination.  PPS: 40%  Chief Complaint: Initial Palliative consult for complex medical decision making  HISTORY OF PRESENT ILLNESS:  Olivia Chambers is a 87 y.o. year old female  with multiple medical problems including Dementia with aggressive behaviors, HTN, HLD, DM, GERD, morbid obesity,  anemia of chronic disease, CKD, chronic leukopenia, vitamin D deficiency, osteoarthrosis, cataract. [name] resides at home with her daughter, Olivia Chambers. We talked  about purpose of telemedicine visit, by phone as video not available. We talked about the last time Olivia Chambers has been independent about 4 years ago. Olivia Chambers endorses her spouse was debilitated, passed away and then Olivia Chambers move in to her home. Olivia Chambers endorses it as become more difficult to care for her. We talked about past medical hx. We talked about chronic disease of dementia. We talked about exertional dyspnea. We talked about medical goals. We talked about her functional abilities, currently now total adl's and she is refusing to bath. We talked about challenges and limitations. We talked about placement, though with insurance Olivia Chambers endorses she was not sure about Medicaid. Will have PC SW contact for further discussion. We talked about role pc in poc. Discussed set up a in person visit in 1 week. Will see how [name] does with HH PT/OT/Nursing. Phyllis in agreement, emotional support provided. Questions answered. Appointment scheduled.   History obtained from review of EMR, discussion with Olivia Chambers, Olivia Chambers daughter with Olivia Chambers.  I reviewed available labs, medications, imaging, studies and related documents from the EMR.  Records reviewed and summarized above.   ROS Full 10 system review of systems performed and negative with exception of: as per HPI.   Physical Exam: deferred CURRENT PROBLEM LIST:  Patient Active Problem List   Diagnosis Date Noted   Diabetes mellitus with diabetic retinopathy 09/23/2015   Hearing loss sensory, bilateral 09/10/2015   Vertigo 08/30/2015   Tinnitus 69/48/5462   Systolic ejection murmur 70/35/0093   Cerebral vascular disease 08/23/2015   Retinopathy, background, nonproliferative, mild 07/05/2015   Benign essential HTN 05/22/2015   Chronic kidney disease (CKD), stage III (moderate) ([place]) 05/22/2015   Dyslipidemia 05/22/2015   Obesity, diabetes, and hypertension syndrome ([place]) 05/22/2015   Osteoarthrosis, unspecified whether generalized or  localized, involving lower leg 05/22/2015   Allergic rhinitis 05/22/2015   Diabetes mellitus with renal manifestation ([place]) 05/22/2015   Vitamin D deficiency 05/22/2015   Gastro-esophageal reflux disease without esophagitis 04/28/2007   PAST MEDICAL HISTORY:  Active Ambulatory Problems    Diagnosis Date Noted   Benign essential HTN 05/22/2015   Chronic kidney disease (CKD), stage III (moderate) ([place]) 05/22/2015   Dyslipidemia 05/22/2015   Gastro-esophageal reflux disease without esophagitis 04/28/2007   Obesity, diabetes, and hypertension syndrome ([place]) 05/22/2015   Osteoarthrosis, unspecified whether generalized or localized, involving lower leg 05/22/2015   Allergic rhinitis 05/22/2015   Diabetes mellitus with renal manifestation ([place]) 05/22/2015   Vitamin D deficiency 05/22/2015   Retinopathy, background, nonproliferative, mild 07/05/2015   Cerebral vascular disease 08/23/2015   Vertigo 08/30/2015   Tinnitus 81/82/9937   Systolic ejection murmur 16/96/7893   Hearing loss sensory, bilateral 09/10/2015   Diabetes mellitus with diabetic retinopathy 09/23/2015   Resolved Ambulatory Problems    Diagnosis Date Noted   No Resolved Ambulatory Problems   Past Medical History:  Diagnosis Date   Allergy    CKD (chronic kidney disease) stage 3, GFR 30-59 ml/min (HCC)    Dermatitis    Diabetes mellitus without complication (HCC)    GERD (gastroesophageal reflux disease)    Hyperlipidemia    Hypertension    Obesity    Osteoarthritis    SOCIAL HX:  Social History   Tobacco Use   Smoking status: Former    Packs/day: 0.50    Types: Cigarettes  Quit date: 11/26/2002    Years since quitting: 18.9   Smokeless tobacco: Never  Substance Use Topics   Alcohol use: No    Alcohol/week: 0.0 standard drinks   FAMILY HX:  Family History  Problem Relation Age of Onset   Hypertension Mother    Diabetes Mother    Cancer Father        Prostate   Hypertension Father    Diabetes  Sister    Dementia Sister    Diabetes Brother     reviewed  ALLERGIES:  Allergies  Allergen Reactions   Banana Anaphylaxis   Pioglitazone     pt states causes headaches   Sitagliptin Other (See Comments)   Shrimp [Shellfish Allergy] Hives and Rash     PERTINENT MEDICATIONS:  Outpatient Encounter Medications as of 10/31/2021  Medication Sig   Alcohol Swabs (B-D SINGLE USE SWABS REGULAR) PADS    amLODipine (NORVASC) 5 MG tablet Take 1 tablet (5 mg total) by mouth daily.   aspirin 81 MG chewable tablet Chew 1 tablet by mouth daily.   atorvastatin (LIPITOR) 40 MG tablet Take 1 tablet (40 mg total) by mouth daily.   azelastine (ASTELIN) 0.1 % nasal spray    Blood Glucose Monitoring Suppl (TRUE METRIX METER) W/DEVICE KIT 1 Units by Does not apply route daily.   Cholecalciferol (VITAMIN D) 2000 UNITS tablet Take 1 tablet by mouth daily.   fluticasone (FLONASE) 50 MCG/ACT nasal spray    glipiZIDE (GLUCOTROL XL) 2.5 MG 24 hr tablet TAKE 1 TABLET EVERY DAY   glucose blood (TRUE METRIX BLOOD GLUCOSE TEST) test strip Twice daily   hydrochlorothiazide (HYDRODIURIL) 25 MG tablet Take 1 tablet (25 mg total) by mouth daily.   lisinopril (PRINIVIL,ZESTRIL) 40 MG tablet Take 1 tablet (40 mg total) by mouth every evening.   meclizine (ANTIVERT) 12.5 MG tablet Take 1 tablet (12.5 mg total) by mouth 3 (three) times daily as needed for dizziness or nausea.   metFORMIN (GLUCOPHAGE) 500 MG tablet TAKE 1 TABLET TWICE DAILY   traMADol (ULTRAM) 50 MG tablet Take 1 tablet (50 mg total) by mouth every 12 (twelve) hours as needed.   TRUEPLUS LANCETS 33G MISC    No facility-administered encounter medications on file as of 10/31/2021.   Thank you for the opportunity to participate in the care of Ms. Lavella Hammock.  The palliative care team will continue to follow. Please call our office at [phone] if we can be of additional assistance.   This chart was dictated using voice recognition software.  Despite best  efforts to proofread,  errors can occur which can change the documentation meaning.   Questions and concerns were addressed. The patient/family was encouraged to call with questions and/or concerns. My contact information was provided. Provided general support and encouragement, no other unmet needs identified   Anton Cheramie Ihor Gully, NP ,

## 2021-11-03 DIAGNOSIS — Z515 Encounter for palliative care: Secondary | ICD-10-CM

## 2021-11-03 NOTE — Progress Notes (Signed)
11/03/21 @ 10:30 AM: PC SW outreached patients daughter, phyllis, via phone, per PC NP - C. Gusler, request to assess patients needs and discuss resources.  Daughter shared that patient has dementia and had began to exhibit aggressive behaviors. Per daughter these behaviors have calmed down over the past couple weeks and patient is moe pleasant. Daughter share that patient is forgetful about taking her medications and bathing. Patient is able dress and feed herself. Patient is ambulatory. Patient has become more incontinent with B/B, as daughter assist with hygiene. Daughter share that patients appetite is fair and patient sleeps well.  SW and daughter discussed caregiver stress and fatigue secondary to patients disease progression. Daughter state that she would like to transition patient to an ALF, but unsure of the cost and the fact that patient has refused to go, SW discussed cost of ALF placement and Medicaid coverage, per daughter patients income is nearly $1500/month, patient could qualify for [facility] M[facility] for memory care with this income. However, daughter is aware that she can not make patient go if she does not want to at this time. SW and daughter discussed adult day programs as an alternative such as PACE. Daughter open to the suggestion and stat she will discuss more with patient. SW will mail information on pace to daughter to address on file.   SW addressed all daughters questions and concerns at this time. Daughter encouraged to call with any additional needs. SW provided general support and encouragement. No other needs voiced or identified at this time.

## 2021-11-08 ENCOUNTER — Other Ambulatory Visit: Payer: Medicare HMO | Admitting: Nurse Practitioner

## 2021-11-08 ENCOUNTER — Other Ambulatory Visit: Payer: Self-pay

## 2021-11-08 ENCOUNTER — Telehealth: Payer: Self-pay | Admitting: Nurse Practitioner

## 2021-11-08 NOTE — Telephone Encounter (Signed)
I called Jamesetta So, Ms. Swofford daughter to confirm PC f/u visit, no answer, message left to recontact to confirm [facility] appointment today and covid screening.

## 2021-11-08 NOTE — Telephone Encounter (Signed)
Olivia Chambers daughter Olivia Chambers contacted about PC f/u visit today. Olivia Chambers endorses Olivia Chambers other daughter died last night and will need to reschedule visit. Discussed will contact Olivia Chambers next week to reschedule. Condolences given

## 2021-12-11 ENCOUNTER — Observation Stay
Admission: EM | Admit: 2021-12-11 | Discharge: 2021-12-14 | Disposition: A | Discharge status: Hospice | Payer: Medicare HMO | Attending: Student | Admitting: Student

## 2021-12-11 ENCOUNTER — Other Ambulatory Visit: Payer: Self-pay

## 2021-12-11 ENCOUNTER — Emergency Department: Payer: Medicare HMO

## 2021-12-11 DIAGNOSIS — Z20822 Contact with and (suspected) exposure to covid-19: Secondary | ICD-10-CM | POA: Diagnosis not present

## 2021-12-11 DIAGNOSIS — E785 Hyperlipidemia, unspecified: Secondary | ICD-10-CM | POA: Diagnosis present

## 2021-12-11 DIAGNOSIS — W1839XA Other fall on same level, initial encounter: Secondary | ICD-10-CM | POA: Insufficient documentation

## 2021-12-11 DIAGNOSIS — N1832 Chronic kidney disease, stage 3b: Secondary | ICD-10-CM | POA: Insufficient documentation

## 2021-12-11 DIAGNOSIS — E876 Hypokalemia: Secondary | ICD-10-CM | POA: Diagnosis not present

## 2021-12-11 DIAGNOSIS — E1129 Type 2 diabetes mellitus with other diabetic kidney complication: Secondary | ICD-10-CM | POA: Diagnosis not present

## 2021-12-11 DIAGNOSIS — Z7984 Long term (current) use of oral hypoglycemic drugs: Secondary | ICD-10-CM | POA: Insufficient documentation

## 2021-12-11 DIAGNOSIS — I10 Essential (primary) hypertension: Secondary | ICD-10-CM | POA: Diagnosis present

## 2021-12-11 DIAGNOSIS — Z8673 Personal history of transient ischemic attack (TIA), and cerebral infarction without residual deficits: Secondary | ICD-10-CM | POA: Insufficient documentation

## 2021-12-11 DIAGNOSIS — I129 Hypertensive chronic kidney disease with stage 1 through stage 4 chronic kidney disease, or unspecified chronic kidney disease: Secondary | ICD-10-CM | POA: Diagnosis not present

## 2021-12-11 DIAGNOSIS — R600 Localized edema: Secondary | ICD-10-CM | POA: Insufficient documentation

## 2021-12-11 DIAGNOSIS — N183 Chronic kidney disease, stage 3 unspecified: Secondary | ICD-10-CM | POA: Diagnosis present

## 2021-12-11 DIAGNOSIS — M7989 Other specified soft tissue disorders: Secondary | ICD-10-CM | POA: Diagnosis present

## 2021-12-11 DIAGNOSIS — H903 Sensorineural hearing loss, bilateral: Secondary | ICD-10-CM | POA: Diagnosis present

## 2021-12-11 DIAGNOSIS — S066XAA Traumatic subarachnoid hemorrhage with loss of consciousness status unknown, initial encounter: Principal | ICD-10-CM | POA: Diagnosis present

## 2021-12-11 DIAGNOSIS — W19XXXA Unspecified fall, initial encounter: Secondary | ICD-10-CM

## 2021-12-11 DIAGNOSIS — R296 Repeated falls: Secondary | ICD-10-CM

## 2021-12-11 DIAGNOSIS — Z79899 Other long term (current) drug therapy: Secondary | ICD-10-CM | POA: Diagnosis not present

## 2021-12-11 DIAGNOSIS — R238 Other skin changes: Secondary | ICD-10-CM

## 2021-12-11 DIAGNOSIS — R1032 Left lower quadrant pain: Secondary | ICD-10-CM | POA: Insufficient documentation

## 2021-12-11 DIAGNOSIS — E1122 Type 2 diabetes mellitus with diabetic chronic kidney disease: Secondary | ICD-10-CM | POA: Diagnosis not present

## 2021-12-11 DIAGNOSIS — E1159 Type 2 diabetes mellitus with other circulatory complications: Secondary | ICD-10-CM | POA: Diagnosis present

## 2021-12-11 DIAGNOSIS — R739 Hyperglycemia, unspecified: Secondary | ICD-10-CM | POA: Insufficient documentation

## 2021-12-11 DIAGNOSIS — Y92009 Unspecified place in unspecified non-institutional (private) residence as the place of occurrence of the external cause: Secondary | ICD-10-CM | POA: Insufficient documentation

## 2021-12-11 DIAGNOSIS — E119 Type 2 diabetes mellitus without complications: Secondary | ICD-10-CM

## 2021-12-11 DIAGNOSIS — K219 Gastro-esophageal reflux disease without esophagitis: Secondary | ICD-10-CM | POA: Diagnosis present

## 2021-12-11 DIAGNOSIS — Z87891 Personal history of nicotine dependence: Secondary | ICD-10-CM | POA: Insufficient documentation

## 2021-12-11 DIAGNOSIS — Z7982 Long term (current) use of aspirin: Secondary | ICD-10-CM | POA: Insufficient documentation

## 2021-12-11 DIAGNOSIS — E1165 Type 2 diabetes mellitus with hyperglycemia: Secondary | ICD-10-CM | POA: Insufficient documentation

## 2021-12-11 DIAGNOSIS — F0390 Unspecified dementia without behavioral disturbance: Secondary | ICD-10-CM | POA: Diagnosis not present

## 2021-12-11 DIAGNOSIS — E669 Obesity, unspecified: Secondary | ICD-10-CM | POA: Diagnosis present

## 2021-12-11 DIAGNOSIS — S0990XA Unspecified injury of head, initial encounter: Secondary | ICD-10-CM | POA: Diagnosis present

## 2021-12-11 DIAGNOSIS — Z794 Long term (current) use of insulin: Secondary | ICD-10-CM

## 2021-12-11 LAB — URINALYSIS, ROUTINE W REFLEX MICROSCOPIC
Bilirubin Urine: NEGATIVE
Glucose, UA: >=500 mg/dL — AB
Ketones, ur: 5 mg/dL — AB
Leukocytes,Ua: NEGATIVE
Nitrite: NEGATIVE
Protein, ur: NEGATIVE mg/dL
Specific Gravity, Urine: 1.017 (ref 1.005–1.030)
pH: 5 (ref 5.0–8.0)

## 2021-12-11 LAB — CBC
HCT: 35.6 % — ABNORMAL LOW (ref 36.0–46.0)
Hemoglobin: 11.4 g/dL — ABNORMAL LOW (ref 12.0–15.0)
MCH: 27.7 pg (ref 26.0–34.0)
MCHC: 32 g/dL (ref 30.0–36.0)
MCV: 86.4 fL (ref 80.0–100.0)
Platelets: 110 10*3/uL — ABNORMAL LOW (ref 150–400)
RBC: 4.12 MIL/uL (ref 3.87–5.11)
RDW: 14.6 % (ref 11.5–15.5)
WBC: 4.1 10*3/uL (ref 4.0–10.5)
nRBC: 0 % (ref 0.0–0.2)

## 2021-12-11 LAB — BASIC METABOLIC PANEL
Anion gap: 10 (ref 5–15)
BUN: 28 mg/dL — ABNORMAL HIGH (ref 8–23)
CO2: 25 mmol/L (ref 22–32)
Calcium: 8.8 mg/dL — ABNORMAL LOW (ref 8.9–10.3)
Chloride: 100 mmol/L (ref 98–111)
Creatinine, Ser: 1.49 mg/dL — ABNORMAL HIGH (ref 0.44–1.00)
GFR, Estimated: 33 mL/min — ABNORMAL LOW (ref >60)
Glucose, Bld: 490 mg/dL — ABNORMAL HIGH (ref 70–99)
Potassium: 4.4 mmol/L (ref 3.5–5.1)
Sodium: 135 mmol/L (ref 135–145)

## 2021-12-11 LAB — RESP PANEL BY RT-PCR (FLU A&B, COVID) ARPGX2
Influenza A by PCR: NEGATIVE
Influenza B by PCR: NEGATIVE
SARS Coronavirus 2 by RT PCR: NEGATIVE

## 2021-12-11 LAB — PHOSPHORUS: Phosphorus: 2.6 mg/dL (ref 2.5–4.6)

## 2021-12-11 LAB — CBG MONITORING, ED
Glucose-Capillary: 499 mg/dL — ABNORMAL HIGH (ref 70–99)
Glucose-Capillary: 264 mg/dL — ABNORMAL HIGH (ref 70–99)

## 2021-12-11 LAB — MAGNESIUM: Magnesium: 1.7 mg/dL (ref 1.7–2.4)

## 2021-12-11 MED ORDER — ACETAMINOPHEN 325 MG PO TABS: 650.0000 mg | ORAL_TABLET | Freq: Four times a day (QID) | ORAL | Status: DC | PRN

## 2021-12-11 MED ORDER — FLUTICASONE PROPIONATE 50 MCG/ACT NA SUSP
2.0000 | Freq: Every day | NASAL | Status: DC | PRN
  Filled 2021-12-11: qty 16

## 2021-12-11 MED ORDER — INSULIN ASPART PROT & ASPART (70-30 MIX) 100 UNIT/ML ~~LOC~~ SUSP
18.0000 [IU] | Freq: Every day | SUBCUTANEOUS | Status: DC
  Filled 2021-12-11 (×2): qty 10

## 2021-12-11 MED ORDER — LABETALOL HCL 5 MG/ML IV SOLN: 5.0000 mg | INTRAVENOUS | Status: DC | PRN

## 2021-12-11 MED ORDER — SODIUM CHLORIDE 0.9 % IV BOLUS
500.0000 mL | Freq: Once | INTRAVENOUS | Status: AC
  Administered 2021-12-11: 500 mL via INTRAVENOUS

## 2021-12-11 MED ORDER — ONDANSETRON HCL 4 MG/2ML IJ SOLN: 4.0000 mg | Freq: Four times a day (QID) | INTRAMUSCULAR | Status: DC | PRN

## 2021-12-11 MED ORDER — INSULIN ASPART 100 UNIT/ML IJ SOLN
0.0000 [IU] | Freq: Three times a day (TID) | INTRAMUSCULAR | Status: DC
  Administered 2021-12-12: 5 [IU] via SUBCUTANEOUS
  Filled 2021-12-11: qty 1

## 2021-12-11 MED ORDER — INSULIN ASPART 100 UNIT/ML IJ SOLN
0.0000 [IU] | Freq: Every day | INTRAMUSCULAR | Status: DC
  Administered 2021-12-11: 3 [IU] via SUBCUTANEOUS
  Filled 2021-12-11: qty 1

## 2021-12-11 MED ORDER — INSULIN ASPART PROT & ASPART (70-30 MIX) 100 UNIT/ML ~~LOC~~ SUSP
14.0000 [IU] | Freq: Every day | SUBCUTANEOUS | Status: DC
  Filled 2021-12-11: qty 10

## 2021-12-11 MED ORDER — ATORVASTATIN CALCIUM 20 MG PO TABS
40.0000 mg | ORAL_TABLET | Freq: Every day | ORAL | Status: DC
  Administered 2021-12-11 – 2021-12-13 (×3): 40 mg via ORAL
  Filled 2021-12-11 (×3): qty 2

## 2021-12-11 MED ORDER — ACETAMINOPHEN 650 MG RE SUPP: 650.0000 mg | Freq: Four times a day (QID) | RECTAL | Status: DC | PRN

## 2021-12-11 MED ORDER — ONDANSETRON HCL 4 MG PO TABS: 4.0000 mg | ORAL_TABLET | Freq: Four times a day (QID) | ORAL | Status: DC | PRN

## 2021-12-11 MED ORDER — INSULIN REGULAR HUMAN 100 UNIT/ML IJ SOLN: 14.0000 [IU] | INTRAMUSCULAR | Status: DC

## 2021-12-11 NOTE — ED Triage Notes (Signed)
Pt to ER via [facility] from home with complaints of weakness. Patient alert, oriented to self only. Altered at baseline. Patient non-compliant with diabetic medications, unknown when last administered. Edema present to bilateral lower extremities w/ drainage and weeping. Patient reports this is new. Denies pain.  Per EMS, patient reports she lives with daughter but at scene daughter reports she does not live there.   Patient reports she goes to dialysis but has stopped- no evidence of an access.   Cbg [phone]

## 2021-12-11 NOTE — H&P (Addendum)
History and Physical   Olivia Chambers:[id number] DOB: August 03, 1928 DOA: 12/11/2021  PCP: Dion Body, MD  Outpatient Specialists: Dr. Jacklynn Ganong [facility] endocrinology Patient coming from: Home via EMS  I have personally briefly reviewed patient's old medical records in [place].  Chief Concern: Fall  HPI: 88 year old female with medical history of advanced dementia, hyperlipidemia, insulin-dependent diabetes type 2, CKD 3B, hypertension, who presents to the emergency via EMS from home for chief concerns of fall.  Vitals in the emergency department showed temperature of 98, respiration rate of 20, heart rate of 89, blood pressure 165/109, improved to 152/88, SPO2 of 99% on room air.  Labs in the emergency department showed serum sodium 135, potassium 4.4, chloride 100, bicarb 25, BUN of 28, serum creatinine of 1.49, nonfasting glucose was 490, GFR 33, WBC 3.9, hemoglobin 11.1, platelets of 113.  Patient had a CT scan of the head without contrast which showed right occipital lobe subarachnoid blood.  EDP consulted with neurosurgeon who recommends no further imaging indicated at this time.  In the emergency department patient was given sodium chloride 500 mL bolus.  At bedside patient is not able to tell me her name, her age, the current calendar year, her current location.  She appeared to be very confused as to why I was there.  Patient did follow commands by opening her mouth, holding my hand.  She was not able to move her lower extremities.  She was able to wiggle her toes, however did not bend her knees for me.  She does talk however is very difficult for her me to understand her and she does not answer my questions.  She deflects my questions.  I called her daughter who states that patient presented to the emergency department because patient's PCP told daughter to bring patient into the hospital.  Per daughter, patient fell once yesterday. Daughter does not know if  patient lost consciousness.  Daughter does not know if there was a syncopal event.  Per daughter, patient was coming out of the bathroom and she fell on the floor. At baseline she ambulates with cane.   Her legs have been swelling for approximately 2 weeks which has made it very difficult for patient to ambulate.  Daughter states that patient has been pocketing food.  Specifically on 12/10/21 that was noticed.  Daughter also states that patient hides food sometimes and that daughter would discover food in random places in the house.  Daughter is in her 48s and is finding it increasingly difficult to care for her elderly mother.  Social history: She is a former tobacco user, quitting in her 39s. Formlery she smoked 1 pack every three days. She denies etoh and recreational drug use. She is retired and formerly worked as Therapist, sports.   Vaccination history: She is vaccianted for covid 19, with three shots. No flu shot yet.   ROS: Unable to complete as patient has advanced dementia  ED Course: Discussed with emergency medicine provider, patient requiring hospitalization for chief concerns of unsafe discharge home and small right occipital lobe subarachnoid bleeding.  Assessment/Plan  Principal Problem:   Frequent falls Active Problems:   Benign essential HTN   Chronic kidney disease (CKD), stage III (moderate) (HCC)   Dyslipidemia   Gastro-esophageal reflux disease without esophagitis   Diabetes mellitus with renal manifestation (HCC)   Hearing loss sensory, bilateral   Subarachnoid hemorrhage following injury   Insulin dependent type 2 diabetes mellitus ([place])   Dementia ([place])  Hyperglycemia   Swelling of lower extremity   Other skin changes   * Frequent falls Assessment & Plan - Fall precautions - Check B12, CK - PT, OT - TOC has been consulted for placement  Cardiovascular and Mediastinum Benign essential HTN Assessment & Plan - Resumed amlodipine 10 mg daily - Labetalol 5 mg IV  every 2 hours as needed for SBP greater than 160 - Recommend a.m. team to confirm again patient's hydrochlorothiazide and lisinopril 40 mg twice daily  Endocrine Insulin dependent type 2 diabetes mellitus ([place]) Assessment & Plan - Patient takes insulin NPH-REGULAR (NOVOLIN 70/30 U-100 INSULIN) 18 units before a.m. meal and 14 units before evening meal - Discussed with pharmacy and ordered insulin NPH-REGULAR (NOVOLIN 70/30 U-100 INSULIN) 18 units before a.m. meal and 14 units before evening meal - Continue insulin SSI with at bedtime coverage - I have not resumed glipizide 2.5 mg daily or metformin 500 mg twice daily - Goal inpatient blood glucose is 140-180  Diabetes mellitus with renal manifestation (HCC) Assessment & Plan -Resumed insulin  Nervous and Auditory Dementia (HCC) Assessment & Plan - Resumed home donepezil 10 mg nightly  Subarachnoid hemorrhage following injury Assessment & Plan - Blood pressure control as above  Musculoskeletal and Integument Other skin changes Assessment & Plan - Query lymphedema - Wound consult  Genitourinary Chronic kidney disease (CKD), stage III (moderate) (HCC) Assessment & Plan - BMP in the a.m.  Other Swelling of lower extremity Assessment & Plan - Ultrasound of bilateral lower extremity ordered to assess for DVT  Hyperglycemia Assessment & Plan - Insulin SSI with at bedtime coverage as above  Dyslipidemia Assessment & Plan - Atorvastatin 40 mg nightly resumed  DVT prophylaxis-pharmacologic DVT prophylaxis has not been ordered myself due to presentation with subarachnoid hemorrhage from fall  Chart reviewed.   DVT prophylaxis: TED hose Code Status: full code  Diet: Heart healthy/carb modified Family Communication: Discussed with daughter over the phone Disposition Plan: Pending clinical course Consults called: TOC, PT, OT Admission status: Telemetry medical, observation  Past Medical History:  Diagnosis Date    Allergy    CKD (chronic kidney disease) stage 3, GFR 30-59 ml/min (HCC)    Dermatitis    Diabetes mellitus without complication (HCC)    GERD (gastroesophageal reflux disease)    Hyperlipidemia    Hypertension    Obesity    Osteoarthritis    Vitamin D deficiency    Past Surgical History:  Procedure Laterality Date   CATARACT EXTRACTION Left 11/16/2014   Right- 12-04-2014   Social History:  reports that she quit smoking about 19 years ago. Her smoking use included cigarettes. She smoked an average of .5 packs per day. She has never used smokeless tobacco. She reports that she does not drink alcohol and does not use drugs.  Allergies  Allergen Reactions   Banana Anaphylaxis   Pioglitazone     pt states causes headaches   Sitagliptin Other (See Comments)   Shrimp [Shellfish Allergy] Hives and Rash   Family History  Problem Relation Age of Onset   Hypertension Mother    Diabetes Mother    Cancer Father        Prostate   Hypertension Father    Diabetes Sister    Dementia Sister    Diabetes Brother    Family history: Family history reviewed and not pertinent.  Prior to Admission medications   Medication Sig Start Date End Date Taking? Authorizing Provider  Alcohol Swabs (B-D SINGLE USE SWABS  REGULAR) PADS  05/07/15   [provider]  amLODipine (NORVASC) 5 MG tablet Take 1 tablet (5 mg total) by mouth daily. 11/11/15   Steele Sizer, MD  aspirin 81 MG chewable tablet Chew 1 tablet by mouth daily. 08/28/07   [provider]  atorvastatin (LIPITOR) 40 MG tablet Take 1 tablet (40 mg total) by mouth daily. 10/25/15   Steele Sizer, MD  azelastine (ASTELIN) 0.1 % nasal spray  09/22/15   [provider]  Blood Glucose Monitoring Suppl (TRUE METRIX METER) W/DEVICE KIT 1 Units by Does not apply route daily. 09/08/15   Steele Sizer, MD  Cholecalciferol (VITAMIN D) 2000 UNITS tablet Take 1 tablet by mouth daily. 10/09/11   [provider]   fluticasone Asencion Islam) 50 MCG/ACT nasal spray  09/21/15   [provider]  glipiZIDE (GLUCOTROL XL) 2.5 MG 24 hr tablet TAKE 1 TABLET EVERY DAY 01/06/16   Ancil Boozer, Drue Stager, MD  glucose blood (TRUE METRIX BLOOD GLUCOSE TEST) test strip Twice daily 09/08/15   Steele Sizer, MD  hydrochlorothiazide (HYDRODIURIL) 25 MG tablet Take 1 tablet (25 mg total) by mouth daily. 11/11/15   Steele Sizer, MD  lisinopril (PRINIVIL,ZESTRIL) 40 MG tablet Take 1 tablet (40 mg total) by mouth every evening. 05/24/15   Steele Sizer, MD  meclizine (ANTIVERT) 12.5 MG tablet Take 1 tablet (12.5 mg total) by mouth 3 (three) times daily as needed for dizziness or nausea. 08/23/15   Daymon Larsen, MD  metFORMIN (GLUCOPHAGE) 500 MG tablet TAKE 1 TABLET TWICE DAILY 05/27/16   Steele Sizer, MD  traMADol (ULTRAM) 50 MG tablet Take 1 tablet (50 mg total) by mouth every 12 (twelve) hours as needed. 11/08/20   Sable Feil, PA-C  TRUEPLUS LANCETS 33G MISC  09/09/15   [provider]   Physical Exam: Vitals:   12/11/21 2100 12/11/21 2206 12/11/21 2256 12/12/21 0055  BP: (!) 152/88 (!) 167/82 (!) 161/79 (!) 154/68  Pulse: 79 81 91 90  Resp: _0 Temp:   98.1 F (36.7 C) 98.4 F (36.9 C)  TempSrc:   Oral Axillary  SpO2: 100% 100% 99% 98%  Height:       Constitutional: appears age-appropriate, frail, NAD, calm, comfortable Eyes: PERRL, lids and conjunctivae normal ENMT: Mucous membranes are moist. Posterior pharynx clear of any exudate or lesions. Age-appropriate dentition. Hearing appropriate Neck: normal, supple, no masses, no thyromegaly Respiratory: clear to auscultation bilaterally, no wheezing, no crackles. Normal respiratory effort. No accessory muscle use.  Cardiovascular: Regular rate and rhythm, no murmurs / rubs / gallops. No extremity edema. 2+ pedal pulses. No carotid bruits.  Abdomen: Morbidly obese abdomen, no tenderness, no masses palpated, no hepatosplenomegaly. Bowel  sounds positive.  Musculoskeletal: no clubbing / cyanosis. No joint deformity upper and lower extremities. Good ROM, no contractures, no atrophy. Normal muscle tone.  Skin: bilateral lower extremity skin changes.   Neurologic: Sensation intact. Strength 5/5 in all 4.  Psychiatric: Normal judgment and insight. Alert and oriented x 3. Normal mood.   EKG: independently reviewed, showing sinus rhythm with rate of 82, QTc 496  Chest x-ray on Admission: Not indicated  CT HEAD WO CONTRAST (5MM)  Result Date: 12/11/2021 CLINICAL DATA:  Bilateral lower extremity edema and weakness. EXAM: CT HEAD WITHOUT CONTRAST TECHNIQUE: Contiguous axial images were obtained from the base of the skull through the vertex without intravenous contrast. RADIATION DOSE REDUCTION: This exam was performed according to the departmental dose-optimization program which includes automated exposure  control, adjustment of the mA and/or kV according to patient size and/or use of iterative reconstruction technique. COMPARISON:  November 08, 2020 FINDINGS: Brain: There is mild cerebral atrophy with widening of the extra-axial spaces and ventricular dilatation. There are areas of decreased attenuation within the white matter tracts of the supratentorial brain, consistent with microvascular disease changes. A small amount of acute subarachnoid blood is seen within the right occipital lobe (axial CT image 16 and 17, CT series 2). There is no evidence of associated mass effect or midline shift. Vascular: No hyperdense vessel or unexpected calcification. Skull: Normal. Negative for fracture or focal lesion. Sinuses/Orbits: A chronic deformity is seen along the medial wall of the right orbit. Other: None. IMPRESSION: Small amount of acute subarachnoid blood within the right occipital lobe. Electronically Signed   By: Virgina Norfolk M.D.   On: 12/11/2021 19:38   CT Cervical Spine Wo Contrast  Result Date: 12/11/2021 CLINICAL DATA:  Weakness.  EXAM: CT CERVICAL SPINE WITHOUT CONTRAST TECHNIQUE: Multidetector CT imaging of the cervical spine was performed without intravenous contrast. Multiplanar CT image reconstructions were also generated. RADIATION DOSE REDUCTION: This exam was performed according to the departmental dose-optimization program which includes automated exposure control, adjustment of the mA and/or kV according to patient size and/or use of iterative reconstruction technique. COMPARISON:  November 08, 2020 FINDINGS: Alignment: Normal. Skull base and vertebrae: No acute fracture. No primary bone lesion or focal pathologic process. Soft tissues and spinal canal: No prevertebral fluid or swelling. No visible canal hematoma. Disc levels: Marked severity endplate sclerosis is seen at the levels of C3-C4, C4-C5, C5-C6 and C6-C7. There is moderate severity narrowing of the anterior atlantoaxial articulation. Marked severity intervertebral disc space narrowing is seen at the levels of C3-C4, C4-C5, C5-C6 and C6-C7. Mild intervertebral disc space narrowing is present at C2-C3. Bilateral mild-to-moderate severity multilevel facet joint hypertrophy is noted. Upper chest: Negative. Other: None. IMPRESSION: 1. No acute fracture or subluxation of the cervical spine. 2. Marked severity multilevel degenerative changes, as described above. Electronically Signed   By: Virgina Norfolk M.D.   On: 12/11/2021 19:46   CT T-SPINE NO CHARGE  Result Date: 12/11/2021 CLINICAL DATA:  Back trauma lower extremity edema and weakness EXAM: CT THORACIC SPINE WITHOUT CONTRAST TECHNIQUE: Multidetector CT images of the thoracic were obtained using the standard protocol without intravenous contrast. RADIATION DOSE REDUCTION: This exam was performed according to the departmental dose-optimization program which includes automated exposure control, adjustment of the mA and/or kV according to patient size and/or use of iterative reconstruction technique. COMPARISON:  None.  FINDINGS: Alignment: Normal. Vertebrae: No acute fracture or focal pathologic process. Paraspinal and other soft tissues: Negative.  Aortic atherosclerosis Disc levels: Extensive multilevel degenerative osteophytes with disc space narrowing and partial ankylosis T6 through T8 and T9 through T10. IMPRESSION: No acute osseous abnormality.  Extensive degenerative changes. Electronically Signed   By: Donavan Foil M.D.   On: 12/11/2021 20:52   CT L-SPINE NO CHARGE  Result Date: 12/11/2021 CLINICAL DATA:  Back trauma EXAM: CT LUMBAR SPINE WITHOUT CONTRAST TECHNIQUE: Multidetector CT imaging of the lumbar spine was performed without intravenous contrast administration. Multiplanar CT image reconstructions were also generated. RADIATION DOSE REDUCTION: This exam was performed according to the departmental dose-optimization program which includes automated exposure control, adjustment of the mA and/or kV according to patient size and/or use of iterative reconstruction technique. COMPARISON:  MRI 02/04/2019 FINDINGS: Segmentation: Hypoplastic ribs at T12. Last well-formed vertebra will be designated L5. Alignment:  Grade 1 anterolisthesis L3 on L4. Vertebrae: No acute fracture or focal pathologic process. Paraspinal and other soft tissues: No paravertebral or paraspinal soft tissue abnormality. Aortic atherosclerosis. Disc levels: At T12-L1, maintained disc space. No canal stenosis. The foramen are patent bilaterally. At L1-L2, disc space narrowing. No canal stenosis. Bilateral facet degenerative changes. Mild bilateral foraminal narrowing. At L2-L3, advanced disc space narrowing. No high-grade canal stenosis. Moderate hypertrophic facet degenerative changes. Moderate left foraminal narrowing. At L3-L4, vacuum disc. Diffuse disc bulge. Moderate canal stenosis secondary to hypertrophic facet degenerative changes and bulky left facet spurring. The foramen appear narrowed bilaterally. At L4-L5 advanced disc space narrowing.  No high-grade canal stenosis. Hypertrophic facet degenerative changes. Moderate right greater than left foraminal narrowing. At L5-S1, disc space narrowing. No canal stenosis. Marked hypertrophic facet degenerative changes. Foramen are patent bilaterally. IMPRESSION: 1. No acute osseous abnormality. 2. Grade 1 anterolisthesis L3 on L4 with multilevel advanced degenerative changes, worst at L3-L4 where there is marked canal stenosis due to listhesis and advanced facet degenerative changes as well as disc disease. Electronically Signed   By: Donavan Foil M.D.   On: 12/11/2021 21:00   CT CHEST ABDOMEN PELVIS WO CONTRAST  Result Date: 12/11/2021 CLINICAL DATA:  Generalized weakness EXAM: CT CHEST, ABDOMEN AND PELVIS WITHOUT CONTRAST TECHNIQUE: Multidetector CT imaging of the chest, abdomen and pelvis was performed following the standard protocol without IV contrast. RADIATION DOSE REDUCTION: This exam was performed according to the departmental dose-optimization program which includes automated exposure control, adjustment of the mA and/or kV according to patient size and/or use of iterative reconstruction technique. COMPARISON:  None. FINDINGS: CT CHEST FINDINGS Cardiovascular: Somewhat limited due to lack of IV contrast. Diffuse atherosclerotic calcifications of the thoracic aorta are noted. No aneurysmal dilatation is seen. No significant Peri calcifications are noted. No cardiac enlargement is seen. Mediastinum/Nodes: Thoracic inlet is within normal limits. No sizable hilar or mediastinal adenopathy is noted. The esophagus as visualized is within normal limits. Lungs/Pleura: Lungs are well aerated bilaterally. Mild dependent atelectatic changes are seen. There is a 4-5 mm nodule within the superior segment of the left lower lobe best seen on image number 50 of series 4. No other sizable parenchymal nodule is noted. No effusion or pneumothorax is seen. Musculoskeletal: Degenerative changes of the thoracic spine  are noted. No acute rib abnormality is noted. CT ABDOMEN PELVIS FINDINGS Hepatobiliary: No focal liver abnormality is seen. No gallstones, gallbladder wall thickening, or biliary dilatation. Gallbladder appears decompressed. Pancreas: Unremarkable. No pancreatic ductal dilatation or surrounding inflammatory changes. Spleen: Normal in size without focal abnormality. Adrenals/Urinary Tract: Adrenal glands are within normal limits bilaterally. No renal calculi or obstructive changes are seen. The ureters are within normal limits. The bladder is well distended. Stomach/Bowel: Scattered diverticular change of the colon is noted without evidence of diverticulitis. The appendix is within normal limits. Small bowel and stomach are unremarkable. Vascular/Lymphatic: Aortic atherosclerosis. No enlarged abdominal or pelvic lymph nodes. Reproductive: Uterus demonstrates diffuse calcifications without focal mass. No adnexal abnormality is seen. Other: No abdominal wall hernia or abnormality. No abdominopelvic ascites. Musculoskeletal: No acute or significant osseous findings. Mild degenerative anterolisthesis of L3 on L4 is noted. IMPRESSION: No acute visceral injury is identified. No acute bony abnormality is noted. 5 mm left solid pulmonary nodule in the left lower lobe as described. No routine follow-up imaging is recommended per Fleischner Society Guidelines. These guidelines do not apply to immunocompromised patients and patients with cancer. Follow up in patients with significant comorbidities  as clinically warranted. For lung cancer screening, adhere to Lung-RADS guidelines. Reference: Radiology. 2017; 284(1):228-43. Aortic Atherosclerosis (ICD10-I70.0). Electronically Signed   By: Inez Catalina M.D.   On: 12/11/2021 20:39    Labs on Admission: I have personally reviewed following labs  CBC: Recent Labs  Lab 12/11/21 1357 12/11/21 2328  WBC 4.1 3.9*  HGB 11.4* 11.1*  HCT 35.6* 34.5*  MCV 86.4 85.2  PLT 110*  606*   Basic Metabolic Panel: Recent Labs  Lab 12/11/21 1357 12/11/21 2328  NA 135  --   K 4.4  --   CL 100  --   CO2 25  --   GLUCOSE 490*  --   BUN 28*  --   CREATININE 1.49*  --   CALCIUM 8.8*  --   MG  --  1.7  PHOS  --  2.6   CBG: Recent Labs  Lab 12/11/21 1351 12/11/21 2212  GLUCAP 499* 264*   Urine analysis:    Component Value Date/Time   COLORURINE YELLOW (A) 12/11/2021 1840   APPEARANCEUR CLEAR (A) 12/11/2021 1840   APPEARANCEUR Clear 10/16/2013 0250   LABSPEC 1.017 12/11/2021 1840   LABSPEC 1.004 10/16/2013 0250   PHURINE 5.0 12/11/2021 1840   GLUCOSEU >=500 (A) 12/11/2021 1840   GLUCOSEU Negative 10/16/2013 0250   HGBUR SMALL (A) 12/11/2021 1840   BILIRUBINUR NEGATIVE 12/11/2021 1840   BILIRUBINUR Negative 10/16/2013 0250   KETONESUR 5 (A) 12/11/2021 1840   PROTEINUR NEGATIVE 12/11/2021 1840   NITRITE NEGATIVE 12/11/2021 1840   LEUKOCYTESUR NEGATIVE 12/11/2021 1840   LEUKOCYTESUR Negative 10/16/2013 0250   Dr. Tobie Poet [facility]  If 7PM-7AM, please contact overnight-coverage provider If 7AM-7PM, please contact day coverage provider [email or web address]  12/12/2021, 1:52 AM

## 2021-12-11 NOTE — ED Provider Notes (Signed)
[facility] Provider Note    Event Date/Time   First MD Initiated Contact with Patient 12/11/21 1750     (approximate)   History   Weakness   HPI  Olivia Chambers is a 88 y.o. female with past medical history of CKD, diabetes, GERD, hyperlipidemia, hypertension presents with generalized weakness.  Patient is unable to provide any history.  She denies any complaints at this time.  She talks about her church and pastor, unable to tell me where she lives.  Note from social worker from 11/03/2021, apparently patient's daughter had discussed caregiver stress and fatigue with the social worker.  Looks like they were working to get her placed.  Currently patient has been exhibiting aggressive behavior.  Discussed with the patient's daughter Jamesetta So who provides independent history.  Patient has had declining behavior due to her dementia over the last several weeks.  [name] daughter herself is in her 42s and is not really able to take care of her anymore.  Patient daughter says she occasionally helps her take her medicines but is not sure whether she is getting all of them.  She has really not had any change in her mental status acutely.  She did find her on the floor yesterday after having fallen.  She is not anticoagulated.  No fevers chills or other recent illnesses.  She tells me that the PCP told her to bring the patient to the ED because of uncontrolled diabetes and worsening renal function.     Past Medical History:  Diagnosis Date   Allergy    CKD (chronic kidney disease) stage 3, GFR 30-59 ml/min (HCC)    Dermatitis    Diabetes mellitus without complication (HCC)    GERD (gastroesophageal reflux disease)    Hyperlipidemia    Hypertension    Obesity    Osteoarthritis    Vitamin D deficiency     Patient Active Problem List   Diagnosis Date Noted   Frequent falls 12/11/2021   Subarachnoid hemorrhage following injury 12/11/2021   Insulin dependent  type 2 diabetes mellitus (HCC) 12/11/2021   Dementia (HCC) 12/11/2021   Hyperglycemia 12/11/2021   Diabetes mellitus with diabetic retinopathy 09/23/2015   Hearing loss sensory, bilateral 09/10/2015   Vertigo 08/30/2015   Tinnitus 08/30/2015   Systolic ejection murmur 08/30/2015   Cerebral vascular disease 08/23/2015   Retinopathy, background, nonproliferative, mild 07/05/2015   Benign essential HTN 05/22/2015   Chronic kidney disease (CKD), stage III (moderate) (HCC) 05/22/2015   Dyslipidemia 05/22/2015   Obesity, diabetes, and hypertension syndrome (HCC) 05/22/2015   Osteoarthrosis, unspecified whether generalized or localized, involving lower leg 05/22/2015   Allergic rhinitis 05/22/2015   Diabetes mellitus with renal manifestation (HCC) 05/22/2015   Vitamin D deficiency 05/22/2015   Gastro-esophageal reflux disease without esophagitis 04/28/2007     Physical Exam  Triage Vital Signs: ED Triage Vitals [12/11/21 1350]  Enc Vitals Group     BP (!) 165/109     Pulse Rate 89     Resp 20     Temp 98 F (36.7 C)     Temp Source Oral     SpO2 97 %     Weight      Height 5' (1.524 m)     Head Circumference      Peak Flow      Pain Score      Pain Loc      Pain Edu?      Excl. in GC?  Most recent vital signs: Vitals:   12/11/21 2206 12/11/21 2256  BP: (!) 167/82 (!) 161/79  Pulse: 81 91  Resp: 16 19  Temp:  98.1 F (36.7 C)  SpO2: 100% 99%     General: Awake, no distress.  CV:  Good peripheral perfusion.  Bilateral lower extremity edema with chronic venous stasis changes, skin breakdown on the bilateral lower extremities, no cellulitis or purulent drainage Resp:  Normal effort.  Abd:  No distention.  Mild tenderness to palpation the left lower quadrant Neuro:             Patient is awake, oriented to self only, tangential but redirectable, only follows minimal commands Other:  No chest wall tenderness, pelvis is stable nontender, able to range bilateral lower  extremities T and L-spine tenderness, no step-offs or ecchymosis   ED Results / Procedures / Treatments  Labs (all labs ordered are listed, but only abnormal results are displayed) Labs Reviewed  BASIC METABOLIC PANEL - Abnormal; Notable for the following components:      Result Value   Glucose, Bld 490 (*)    BUN 28 (*)    Creatinine, Ser 1.49 (*)    Calcium 8.8 (*)    GFR, Estimated 33 (*)    All other components within normal limits  CBC - Abnormal; Notable for the following components:   Hemoglobin 11.4 (*)    HCT 35.6 (*)    Platelets 110 (*)    All other components within normal limits  URINALYSIS, ROUTINE W REFLEX MICROSCOPIC - Abnormal; Notable for the following components:   Color, Urine YELLOW (*)    APPearance CLEAR (*)    Glucose, UA >=500 (*)    Hgb urine dipstick SMALL (*)    Ketones, ur 5 (*)    Bacteria, UA RARE (*)    All other components within normal limits  CBG MONITORING, ED - Abnormal; Notable for the following components:   Glucose-Capillary 499 (*)    All other components within normal limits  CBG MONITORING, ED - Abnormal; Notable for the following components:   Glucose-Capillary 264 (*)    All other components within normal limits  RESP PANEL BY RT-PCR (FLU A&B, COVID) ARPGX2  BASIC METABOLIC PANEL  VITAMIN [id number]  CBC  MAGNESIUM  PHOSPHORUS  HEMOGLOBIN A1C     EKG  EKG interpreted by myself, rhythm somewhat unclear, PVCs, right bundle branch block, no acute ischemic changes   RADIOLOGY A CT head which shows a small subarachnoid hemorrhage in the right occipital lobe, agree with radiology report   PROCEDURES:  Critical Care performed: No  Procedures  The patient is on the cardiac monitor to evaluate for evidence of arrhythmia and/or significant heart rate changes.   MEDICATIONS ORDERED IN ED: Medications  atorvastatin (LIPITOR) tablet 40 mg (40 mg Oral Given 12/11/21 2231)  fluticasone (FLONASE) 50 MCG/ACT nasal spray 2 spray  (has no administration in time range)  acetaminophen (TYLENOL) tablet 650 mg (has no administration in time range)    Or  acetaminophen (TYLENOL) suppository 650 mg (has no administration in time range)  ondansetron (ZOFRAN) tablet 4 mg (has no administration in time range)    Or  ondansetron (ZOFRAN) injection 4 mg (has no administration in time range)  insulin aspart (novoLOG) injection 0-9 Units (has no administration in time range)  insulin aspart (novoLOG) injection 0-5 Units (3 Units Subcutaneous Given 12/11/21 2230)  insulin aspart protamine- aspart (NOVOLOG MIX 70/30) injection 18 Units (has no administration  in time range)    And  insulin aspart protamine- aspart (NOVOLOG MIX 70/30) injection 14 Units (has no administration in time range)  labetalol (NORMODYNE) injection 5 mg (has no administration in time range)  sodium chloride 0.9 % bolus 500 mL (0 mLs Intravenous Stopped 12/11/21 1801)     IMPRESSION / MDM / ASSESSMENT AND PLAN / ED COURSE  I reviewed the triage vital signs and the nursing notes.                              Differential diagnosis includes, but is not limited to, DKA, AKI, trauma, infection  The patient is a 88 year old female with baseline dementia with behavioral disturbance who is here primarily for elevated blood sugar.  Patient not able provide history but in speaking with her daughter seems like the reason she is here is because she is no longer able to take care for her.  Unclear if she has been getting her insulin at home.  Patient did notably have a fall yesterday that was unwitnessed.  On exam she has no signs of trauma.  She has some mild diffuse tenderness primarily in the left lower quadrant as well as some T and L-spine tenderness without step-offs.  She does have lower extremity edema with some weeping but no obvious cellulitis.  Her labs are overall reassuring.  Her renal function is really similar to where its been and she is hyperglycemic with a  sugar in the 400s but not in DKA.  CT of her head is notable for a very small area of subarachnoid hemorrhage in the right occipital lobe.  Discussed with Dr. Cari Caraway with neurosurgery who notes that there is really no need to even repeat the scan since she fell yesterday.  Patient is not anticoagulated.  Given this trauma I did obtain a CT of the chest abdomen pelvis and T and L-spine as well which do not have any other acute traumatic findings.  Given patient is unable to care for herself and has exhibited that she is really not safe to be at home as evidenced by this traumatic injury will admit to the hospital service.   FINAL CLINICAL IMPRESSION(S) / ED DIAGNOSES   Final diagnoses:  Fall, initial encounter  Traumatic subarachnoid hemorrhage with unknown loss of consciousness status, initial encounter     Rx / DC Orders   ED Discharge Orders     None        Note:  This document was prepared using Dragon voice recognition software and may include unintentional dictation errors.   Rada Hay, MD 12/11/21 [phone]

## 2021-12-11 NOTE — ED Notes (Signed)
Pts IV not working. Attempted IV start by this RN and then [employer]. Attempts unsuccessful. Final attempt resulted in 20G IV at the [facility]. This RN called receiving RN to provide update. Informed [facility], RN that Charge RN Erie Noe also recommended IV team consult order to be placed when pt is received due to new IV being in the same difficult area. IV flushed with 30 cc of NS - no infiltration noted at this time.

## 2021-12-11 NOTE — ED Notes (Signed)
See triage note, pt disoriented. Cannot state why here.  Nad noted.  Stretcher locked in lowest position. Call bell in reach External cath in place

## 2021-12-11 NOTE — Assessment & Plan Note (Addendum)
-   Patient takes insulin NPH-REGULAR (NOVOLIN 70/30 U-100 INSULIN) 18 units before a.m. meal and 14 units before evening meal - Discussed with pharmacy and ordered insulin NPH-REGULAR (NOVOLIN 70/30 U-100 INSULIN) 18 units before a.m. meal and 14 units before evening meal - Continue insulin SSI with at bedtime coverage - I have not resumed glipizide 2.5 mg daily or metformin 500 mg twice daily - Goal inpatient blood glucose is 140-180

## 2021-12-11 NOTE — ED Notes (Signed)
Pt repeatedly taking off monitors and taking out purewick. Replaced monitors.

## 2021-12-12 ENCOUNTER — Observation Stay: Payer: Medicare HMO

## 2021-12-12 ENCOUNTER — Observation Stay
Admit: 2021-12-12 | Discharge: 2021-12-12 | Disposition: A | Discharge status: Home / Self Care | Payer: Medicare HMO | Attending: Family Medicine | Admitting: Family Medicine

## 2021-12-12 DIAGNOSIS — F03C18 Unspecified dementia, severe, with other behavioral disturbance: Secondary | ICD-10-CM | POA: Diagnosis not present

## 2021-12-12 DIAGNOSIS — E1121 Type 2 diabetes mellitus with diabetic nephropathy: Secondary | ICD-10-CM | POA: Diagnosis not present

## 2021-12-12 DIAGNOSIS — I10 Essential (primary) hypertension: Secondary | ICD-10-CM

## 2021-12-12 DIAGNOSIS — M7989 Other specified soft tissue disorders: Secondary | ICD-10-CM | POA: Diagnosis present

## 2021-12-12 DIAGNOSIS — N1831 Chronic kidney disease, stage 3a: Secondary | ICD-10-CM

## 2021-12-12 DIAGNOSIS — R238 Other skin changes: Secondary | ICD-10-CM | POA: Insufficient documentation

## 2021-12-12 LAB — BASIC METABOLIC PANEL
Anion gap: 10 (ref 5–15)
BUN: 19 mg/dL (ref 8–23)
CO2: 26 mmol/L (ref 22–32)
Calcium: 8.6 mg/dL — ABNORMAL LOW (ref 8.9–10.3)
Chloride: 102 mmol/L (ref 98–111)
Creatinine, Ser: 1.12 mg/dL — ABNORMAL HIGH (ref 0.44–1.00)
GFR, Estimated: 46 mL/min — ABNORMAL LOW (ref >60)
Glucose, Bld: 201 mg/dL — ABNORMAL HIGH (ref 70–99)
Potassium: 3.4 mmol/L — ABNORMAL LOW (ref 3.5–5.1)
Sodium: 138 mmol/L (ref 135–145)

## 2021-12-12 LAB — CBC
HCT: 34.5 % — ABNORMAL LOW (ref 36.0–46.0)
Hemoglobin: 11.1 g/dL — ABNORMAL LOW (ref 12.0–15.0)
MCH: 27.4 pg (ref 26.0–34.0)
MCHC: 32.2 g/dL (ref 30.0–36.0)
MCV: 85.2 fL (ref 80.0–100.0)
Platelets: 113 10*3/uL — ABNORMAL LOW (ref 150–400)
RBC: 4.05 MIL/uL (ref 3.87–5.11)
RDW: 14.6 % (ref 11.5–15.5)
WBC: 3.9 10*3/uL — ABNORMAL LOW (ref 4.0–10.5)
nRBC: 0 % (ref 0.0–0.2)

## 2021-12-12 LAB — HEMOGLOBIN A1C
Hgb A1c MFr Bld: 14.6 % — ABNORMAL HIGH (ref 4.8–5.6)
Mean Plasma Glucose: 372.32 mg/dL

## 2021-12-12 LAB — GLUCOSE, CAPILLARY
Glucose-Capillary: 275 mg/dL — ABNORMAL HIGH (ref 70–99)
Glucose-Capillary: 321 mg/dL — ABNORMAL HIGH (ref 70–99)
Glucose-Capillary: 163 mg/dL — ABNORMAL HIGH (ref 70–99)

## 2021-12-12 LAB — CK: Total CK: 165 U/L (ref 38–234)

## 2021-12-12 LAB — BRAIN NATRIURETIC PEPTIDE: B Natriuretic Peptide: 79.6 pg/mL (ref 0.0–100.0)

## 2021-12-12 LAB — VITAMIN B12: Vitamin B-12: 1399 pg/mL — ABNORMAL HIGH (ref 180–914)

## 2021-12-12 MED ORDER — INSULIN GLARGINE-YFGN 100 UNIT/ML ~~LOC~~ SOLN
22.0000 [IU] | Freq: Every day | SUBCUTANEOUS | Status: DC
  Administered 2021-12-12 – 2021-12-14 (×3): 22 [IU] via SUBCUTANEOUS
  Filled 2021-12-12 (×4): qty 0.22

## 2021-12-12 MED ORDER — JUVEN PO PACK
1.0000 | PACK | Freq: Two times a day (BID) | ORAL | Status: DC
  Administered 2021-12-13 – 2021-12-14 (×3): 1 via ORAL

## 2021-12-12 MED ORDER — HYDROCHLOROTHIAZIDE 12.5 MG PO TABS
12.5000 mg | ORAL_TABLET | Freq: Every day | ORAL | Status: DC
  Administered 2021-12-13: 12.5 mg via ORAL
  Filled 2021-12-12: qty 1

## 2021-12-12 MED ORDER — DONEPEZIL HCL 5 MG PO TABS
10.0000 mg | ORAL_TABLET | Freq: Every day | ORAL | Status: DC
  Administered 2021-12-12 – 2021-12-13 (×2): 10 mg via ORAL
  Filled 2021-12-12 (×2): qty 2

## 2021-12-12 MED ORDER — AMLODIPINE BESYLATE 10 MG PO TABS
10.0000 mg | ORAL_TABLET | Freq: Every day | ORAL | Status: DC
  Filled 2021-12-12: qty 1

## 2021-12-12 MED ORDER — LISINOPRIL 10 MG PO TABS
10.0000 mg | ORAL_TABLET | Freq: Every day | ORAL | Status: DC
  Administered 2021-12-13 – 2021-12-14 (×2): 10 mg via ORAL
  Filled 2021-12-12 (×2): qty 1

## 2021-12-12 MED ORDER — INSULIN GLARGINE-YFGN 100 UNIT/ML ~~LOC~~ SOLN
22.0000 [IU] | Freq: Every day | SUBCUTANEOUS | Status: DC
  Filled 2021-12-12: qty 0.22

## 2021-12-12 MED ORDER — ADULT MULTIVITAMIN W/MINERALS CH
1.0000 | ORAL_TABLET | Freq: Every day | ORAL | Status: DC
  Administered 2021-12-12 – 2021-12-14 (×3): 1 via ORAL
  Filled 2021-12-12 (×3): qty 1

## 2021-12-12 MED ORDER — INSULIN ASPART 100 UNIT/ML IJ SOLN: 0.0000 [IU] | Freq: Three times a day (TID) | INTRAMUSCULAR | Status: DC

## 2021-12-12 MED ORDER — HYDROCERIN EX CREA
TOPICAL_CREAM | Freq: Every day | CUTANEOUS | Status: DC
  Filled 2021-12-12: qty 113

## 2021-12-12 MED ORDER — INSULIN ASPART 100 UNIT/ML IJ SOLN
0.0000 [IU] | Freq: Three times a day (TID) | INTRAMUSCULAR | Status: DC
  Administered 2021-12-12: 11 [IU] via SUBCUTANEOUS
  Administered 2021-12-13: 09:00:00 2 [IU] via SUBCUTANEOUS
  Administered 2021-12-13: 3 [IU] via SUBCUTANEOUS
  Administered 2021-12-13: 8 [IU] via SUBCUTANEOUS
  Administered 2021-12-14: 09:00:00 2 [IU] via SUBCUTANEOUS
  Administered 2021-12-14: 12:00:00 5 [IU] via SUBCUTANEOUS
  Filled 2021-12-12 (×5): qty 1

## 2021-12-12 NOTE — Assessment & Plan Note (Signed)
-   Resumed home donepezil 10 mg nightly ?

## 2021-12-12 NOTE — Assessment & Plan Note (Signed)
Creatinine stable relative to baseline 

## 2021-12-12 NOTE — Progress Notes (Signed)
Patient removed telemetry; patient gets more agitated when tele is being replaced. NP K. Foust notified. Tele notified to place pt on standby. Will reattempt.

## 2021-12-12 NOTE — Progress Notes (Signed)
Dressings to both legs changed- patient screaming and trying to hit during dressing change; assisted by another nursing staff. Unable to measure accurately due to patient being noncompliant. Dressing in place. Both lower extremities (posterior) are cont to weep.

## 2021-12-12 NOTE — Assessment & Plan Note (Signed)
-   Atorvastatin 40 mg nightly resumed 

## 2021-12-12 NOTE — Assessment & Plan Note (Signed)
Continue atorvastatin

## 2021-12-12 NOTE — Hospital Course (Signed)
Mrs. Brungard is a 88 yo. F with dementia and aggression, lives at home with daughter, Palliative care involved, SW have recommended Memory Care, DM, CKD IIIb baseline Cr 1.2-1.5, obesity, and HTN who presented with a fall, "weakness" and leg weeping.  Evidently leg swelling has been progressive over months, now so bad she cannot walk, fell tonight.    In the ER, CT head showed traumatic SAH, small.  Discussed with Neurosurgery, no intervention needed.   CT chest showed no edema, but the patient developed hypoxia and was admitted for work up of swelling, hypoxia, weakness/shortness of breath.

## 2021-12-12 NOTE — Assessment & Plan Note (Signed)
A1c corresponds to an average sugar of 372 mg/dL. Eating well - Start Lantus -Hold 70/30 - Mealtime and sliding scale insulin

## 2021-12-12 NOTE — Assessment & Plan Note (Addendum)
-   Insulin SSI with at bedtime coverage as above ?

## 2021-12-12 NOTE — Progress Notes (Signed)
*  PRELIMINARY RESULTS* Echocardiogram 2D Echocardiogram has been performed.  Joanette Gula Parks Czajkowski 12/12/2021, 1:55 PM

## 2021-12-12 NOTE — Assessment & Plan Note (Signed)
-   Query lymphedema - Wound consult

## 2021-12-12 NOTE — Assessment & Plan Note (Signed)
-   Resumed amlodipine 10 mg daily - Labetalol 5 mg IV every 2 hours as needed for SBP greater than 160 - Recommend a.m. team to confirm again patient's hydrochlorothiazide and lisinopril 40 mg twice daily

## 2021-12-12 NOTE — Assessment & Plan Note (Signed)
Chronic.  BNP normal, Chest imaging without edema or effusions.   - Follow echo

## 2021-12-12 NOTE — Assessment & Plan Note (Signed)
No treatment or follow up necessary

## 2021-12-12 NOTE — Progress Notes (Signed)
Notified provider that patient took IV out and o2 sats in 70s on RA. Spoke with Dr Loleta Books on phone. Provider instructed to take Tele off of patient, leave o2 [place] on patient, and prepare for new IV placement. Provider stating he will come to bedside to assess patient.

## 2021-12-12 NOTE — Consult Note (Signed)
WOC Nurse Consult Note: Reason for Consult:Bilateral LE edema with traumatic partial thickness wound to LLE post fall Wound type: trauma in the presence of venous insufficiency Pressure Injury POA: N/A Measurement:To be measured today by Bedside RN and documented on Nursing Flow Sheet Wound bed:red, moist Drainage (amount, consistency, odor) small serous Periwound: dry, flaking Dressing procedure/placement/frequency: Topical care guidance is provided for Nursing staff for the care of the LLE partial thickness area of skin loss and bilateral LE edema using Eucerin cream as an emollient followed by covering of the area of skin loss with an antimicrobial nonadherent (xeroform) covered with an ABD and secured with Kerlix wrap from toe to knee. This is to be topped with ACE applied in a similar manner. Venous insufficiency will be addressed with this mild compression and elevation. Pressure injury prevention will be addressed with house skin care protocol elements including turning and repositioning wo minimize time in the supine position, placement of a sacral prophylactic foam dressing and placement of the feet into bilateral pressure redistribution heel boots (Prevalon).  WOC nursing team will not follow, but will remain available to this patient, the nursing and medical teams.  Please re-consult if needed. Thanks, Ladona Mow, MSN, RN, GNP, [facility]  Pager# [phone]

## 2021-12-12 NOTE — Progress Notes (Signed)
Initial Nutrition Assessment  DOCUMENTATION CODES:   Morbid obesity  INTERVENTION:   -1 packet Juven BID, each packet provides 95 calories, 2.5 grams of protein (collagen), and 9.8 grams of carbohydrate (3 grams sugar); also contains 7 grams of L-arginine and L-glutamine, 300 mg vitamin C, 15 mg vitamin E, 1.2 mcg vitamin B-12, 9.5 mg zinc, 200 mg calcium, and 1.5 g  Calcium Beta-hydroxy-Beta-methylbutyrate to support wound healing  -Feeding assistance with meals -MVI with minerals daily  NUTRITION DIAGNOSIS:   Increased nutrient needs related to wound healing as evidenced by estimated needs.  GOAL:   Patient will meet greater than or equal to 90% of their needs  MONITOR:   PO intake, Supplement acceptance, Labs, Weight trends, Skin, I & O's  REASON FOR ASSESSMENT:   Malnutrition Screening Tool    ASSESSMENT:   88 year old female with medical history of advanced dementia, hyperlipidemia, insulin-dependent diabetes type 2, CKD 3B, hypertension, who presents to the emergency via EMS from home for chief concerns of fall.  Pt admitted with frequent falls.  Spoke with pt at bedside, who continued to reports "I'm doing ok". When pt asked where she was, she stated "I'm in [place], [place]", but unable to identify that she was in the hospital. RD attempted to reorient to place and situation.   Pt currently on a heart healthy/ carb modified diet. Breakfast tray on tray table unattempted.   Reviewed wt hx; wt has been stable over the past several years.   Lab Results  Component Value Date   HGBA1C 14.6 (H) 12/12/2021   PTA DM medications are 14 units novolin 70/30 daily and 18 units novolin 70/30 daily.   Labs reviewed: K: 3.4, CBGS: 264-321 (inpatient orders for glycemic control are 0-15 units insulin aspart TID with meals, 0-5 units insulin aspart daily at bedtime, and 22 units insulin glargine-yfgn daily).    NUTRITION - FOCUSED PHYSICAL EXAM:  Flowsheet Row Most Recent Value   Orbital Region No depletion  Upper Arm Region No depletion  Thoracic and Lumbar Region No depletion  Buccal Region No depletion  Temple Region No depletion  Clavicle Bone Region No depletion  Clavicle and Acromion Bone Region No depletion  Scapular Bone Region No depletion  Dorsal Hand No depletion  Patellar Region No depletion  Anterior Thigh Region No depletion  Posterior Calf Region No depletion  Edema (RD Assessment) Moderate  Hair Reviewed  Eyes Reviewed  Mouth Reviewed  Skin Reviewed  Nails Reviewed       Diet Order:   Diet Order             Diet heart healthy/carb modified Room service appropriate? Yes; Fluid consistency: Thin  Diet effective now                   EDUCATION NEEDS:   No education needs have been identified at this time  Skin:  Skin Assessment: Skin Integrity Issues: Skin Integrity Issues:: Other (Comment) Other: open areas to rt and lt pretibial  Last BM:  12/12/21  Height:   Ht Readings from Last 1 Encounters:  12/11/21 5' (1.524 m)    Weight:   Wt Readings from Last 1 Encounters:  11/08/20 97.1 kg    Ideal Body Weight:  45.5 kg  BMI:  Body mass index is 41.79 kg/m.  Estimated Nutritional Needs:   Kcal:  1600-1800  Protein:  75-90 grams  Fluid:  > 1.6 L    Levada Schilling, RD, LDN, CDCES Registered Dietitian II  Certified Diabetes Care and Education Specialist Please refer to [facility] for RD and/or RD on-call/weekend/after hours pager

## 2021-12-12 NOTE — Evaluation (Addendum)
Occupational Therapy Evaluation Patient Details Name: NOVAH NESSEL MRN: [id number] DOB: 07-23-1928 Today's Date: 12/12/2021   History of Present Illness BAY JARQUIN is a 88 y.o. female with past medical history of advanced dementia, CKD, diabetes, GERD, hyperlipidemia, hypertension presents with generalized weakness.   Clinical Impression   Ms. Prowell was seen for OT evaluation this date. Pt presents generally confused 2/2 cognitive impairment at baseline. PLOF/home set-up information obtained from daughter at bedside. Per daughter, prior to hospital admission, pt required at least set-up assistance with ADL/IADL management. She was walking with a SPC ~2 weeks prior to admission, and working with home health PT on improving her mobility. Pt lives with her daughter in a 1 level home with at least 1 STE at the back entrance. Currently pt demonstrates impairments as described below (See OT problem list) which functionally limit her ability to perform ADL/self-care tasks. Pt currently requires MOD-MAX A+2 for functional mobility, LB dressing, and toileting.  Pt would benefit from skilled OT services to address noted impairments and functional limitations (see below for any additional details) in order to maximize safety and independence while minimizing falls risk and caregiver burden. Upon hospital discharge, recommend STR to maximize pt safety and return to PLOF.        Recommendations for follow up therapy are one component of a multi-disciplinary discharge planning process, led by the attending physician.  Recommendations may be updated based on patient status, additional functional criteria and insurance authorization.   Follow Up Recommendations  Skilled nursing-short term rehab (<3 hours/day)    Assistance Recommended at Discharge Frequent or constant Supervision/Assistance  Patient can return home with the following Two people to help with walking and/or transfers;Two people to help with  bathing/dressing/bathroom;Assistance with cooking/housework;Assistance with feeding;Help with stairs or ramp for entrance;Assist for transportation;Direct supervision/assist for financial management;Direct supervision/assist for medications management    Functional Status Assessment  Patient has had a recent decline in their functional status and demonstrates the ability to make significant improvements in function in a reasonable and predictable amount of time.  Equipment Recommendations  BSC/3in1 [facility] Bed)    Recommendations for Other Services       Precautions / Restrictions Precautions Precautions: Fall Restrictions Weight Bearing Restrictions: No      Mobility Bed Mobility Overal bed mobility: Needs Assistance Bed Mobility: Supine to Sit, Sit to Supine     Supine to sit: Mod assist, +2 for physical assistance, +2 for safety/equipment, HOB elevated Sit to supine: +2 for physical assistance, +2 for safety/equipment, Max assist        Transfers Overall transfer level: Needs assistance Equipment used: 2 person hand held assist Transfers: Bed to chair/wheelchair/BSC       Step pivot transfers: Mod assist, +2 safety/equipment     General transfer comment: Requiring increased time and max VC's for sequencing steps      Balance Overall balance assessment: Needs assistance Sitting-balance support: Bilateral upper extremity supported, Feet supported Sitting balance-Leahy Scale: Fair     Standing balance support: Bilateral upper extremity supported, During functional activity Standing balance-Leahy Scale: Poor Standing balance comment: Requires +2 physical assist to maintain standing balance.                           ADL either performed or assessed with clinical judgement   ADL Overall ADL's : Needs assistance/impaired  General ADL Comments: Pt is significantly functionally limited by  generalized weakness, decreased activity tolerance, increased pain in BLE, and decreased cognition. She requires +2 MOD-MAX A for all bed/functional mobility. +2 MOD for step pivot transfer to/from [facility] back to bed. MAX +2 for sup>sit t/f. MAX +2 for standing peri-care. With consistent cueing for safety/sequencing t/o functional tasks     Vision         Perception     Praxis      Pertinent Vitals/Pain Pain Assessment Pain Assessment: Faces Faces Pain Scale: Hurts a little bit Pain Location: Pt grimaces, pulls away, and yells out when BLE touched. Is unable to verbalize level of pain or localize. Pain Descriptors / Indicators: Grimacing, Guarding Pain Intervention(s): Limited activity within patient's tolerance, Monitored during session, Repositioned     Hand Dominance Right   Extremity/Trunk Assessment Upper Extremity Assessment Upper Extremity Assessment: Generalized weakness   Lower Extremity Assessment Lower Extremity Assessment: Generalized weakness       Communication Communication Communication: Other (comment) (mumbling, difficulty with word finding.)   Cognition Arousal/Alertness: Awake/alert   Overall Cognitive Status: History of cognitive impairments - at baseline                                 General Comments: Per daughter, pt baseline cognition has been variable. Is generally able to identify family members, oriented to self and place at baseline.     General Comments       Exercises Other Exercises Other Exercises: Pt/daughter educated on role of OT in acute setting, safe use of AE/DME during ADL management, and DC recs this date. OT/PT facilitated bed/functional mobility, toilet transfer, and toileting as described above. See ADL section for additional details.   Shoulder Instructions      Home Living Family/patient expects to be discharged to:: Private residence Living Arrangements: Children Available Help at Discharge:  Family;Available 24 hours/day Type of Home: House Home Access: Stairs to enter;Ramped entrance     Home Layout: One level         Bathroom Toilet: Standard     Home Equipment: Cane - single point          Prior Functioning/Environment Prior Level of Function : Needs assist;History of Falls (last six months)  Cognitive Assist : ADLs (cognitive);Mobility (cognitive) Mobility (Cognitive): Step by step cues ADLs (Cognitive): Step by step cues Physical Assist : Mobility (physical);ADLs (physical) Mobility (physical): Bed mobility;Transfers;Gait ADLs (physical): Feeding;Grooming;Bathing;Dressing;Toileting;IADLs Mobility Comments: Pt's daughter reports decline in mobiltiy last few weeks requiring heavy physical assist for mobility; Prior to this, she had been working with home health. Using a SPC for functional mobility and required set-up asssit for ADL management. ADLs Comments: Pt requires assist with ADL/IADL management. Per daughter, wanders at night. Requires 24/7 supervision 2/2 safety.        OT Problem List: Decreased strength;Decreased coordination;Decreased activity tolerance;Decreased safety awareness;Decreased range of motion;Impaired balance (sitting and/or standing);Decreased knowledge of use of DME or AE;Increased edema;Impaired UE functional use;Decreased cognition;Impaired vision/perception      OT Treatment/Interventions: Self-care/ADL training;Therapeutic exercise;Therapeutic activities;DME and/or AE instruction;Patient/family education;Balance training;Energy conservation;Cognitive remediation/compensation    OT Goals(Current goals can be found in the care plan section) Acute Rehab OT Goals Patient Stated Goal: To go to rehab. OT Goal Formulation: With family Time For Goal Achievement: 12/26/21 Potential to Achieve Goals: Good ADL Goals Pt Will Perform Eating: sitting;with set-up;with supervision Pt Will Perform Grooming: sitting;with  supervision;with  set-up Pt Will Transfer to Toilet: bedside commode;stand pivot transfer;with min assist Pt Will Perform Toileting - Clothing Manipulation and hygiene: sit to/from stand;with mod assist;with set-up  OT Frequency: Min 2X/week    Co-evaluation PT/OT/SLP Co-Evaluation/Treatment: Yes Reason for Co-Treatment: Complexity of the patient's impairments (multi-system involvement);Necessary to address cognition/behavior during functional activity;For patient/therapist safety;To address functional/ADL transfers PT goals addressed during session: Mobility/safety with mobility;Balance;Strengthening/ROM OT goals addressed during session: ADL's and self-care;Proper use of Adaptive equipment and DME      AM-PAC OT "6 Clicks" Daily Activity     Outcome Measure Help from another person eating meals?: A Little Help from another person taking care of personal grooming?: A Little Help from another person toileting, which includes using toliet, bedpan, or urinal?: Total Help from another person bathing (including washing, rinsing, drying)?: A Lot Help from another person to put on and taking off regular upper body clothing?: A Lot Help from another person to put on and taking off regular lower body clothing?: A Lot 6 Click Score: 13   End of Session Equipment Utilized During Treatment: Gait belt [facility](BSC) Nurse Communication: Mobility status;Other (comment) (DC recs)  Activity Tolerance: Patient tolerated treatment well Patient left: in bed;with call bell/phone within reach;with bed alarm set;with family/visitor present;with nursing/sitter in room  OT Visit Diagnosis: Other abnormalities of gait and mobility (R26.89);Muscle weakness (generalized) (M62.81)                Time: 1191-47820944-1045 OT Time Calculation (min): 61 min Charges:  OT General Charges $OT Visit: 1 Visit OT Evaluation $OT Eval Moderate Complexity: 1 Mod OT Treatments $Self Care/Home Management : 23-37 mins  Rockney GheeSerenity Nyashia Raney, M.S.,  OTR/L Feeding Team - [facility] Ascom: [phone] 12/12/21, 1:22 PM

## 2021-12-12 NOTE — Progress Notes (Signed)
Attempted to place telemetry; pt just ripped it off before RN can connect leads.

## 2021-12-12 NOTE — Progress Notes (Signed)
Patient confused and combative during ADLs. Patient hitting staff when getting cleaned up upon admission to the unit; needed 3-4 nursing staff.  Patient on telemetry; patient keeps removing electrodes or pulling/tagging. Attempted to cover/hide; unsuccessful as patient finds them. Patient also attempted to get OOB.   MD paged for FYI and to request order for telesitter. Order received.

## 2021-12-12 NOTE — Assessment & Plan Note (Signed)
-  Resumed insulin

## 2021-12-12 NOTE — Hospital Course (Signed)
88 year old female with medical history of advanced dementia, hyperlipidemia, insulin-dependent diabetes type 2, CKD 3B, hypertension, who presents to the emergency via EMS from home for chief concerns of fall.  Vitals in the emergency department showed temperature of 98, respiration rate of 20, heart rate of 89, blood pressure 165/109, improved to 152/88, SPO2 of 99% on room air.  Labs in the emergency department showed serum sodium 135, potassium 4.4, chloride 100, bicarb 25, BUN of 28, serum creatinine of 1.49, nonfasting glucose was 490, GFR 33, WBC 3.9, hemoglobin 11.1, platelets of 113.  Patient had a CT scan of the head without contrast which showed right occipital lobe subarachnoid blood.  EDP consulted with neurosurgeon who recommends no further imaging indicated at this time.  In the emergency department patient was given sodium chloride 500 mL bolus.

## 2021-12-12 NOTE — Assessment & Plan Note (Signed)
-   Ultrasound of bilateral lower extremity ordered to assess for DVT

## 2021-12-12 NOTE — Progress Notes (Signed)
RN changed bilateral leg dressings per Wound care note, however, patient getting agitated and yelling. Dressings complete to the best of RN's ability. Medicated cream applied to skin under dressings. Will continue to monitor, no other needs at this time.

## 2021-12-12 NOTE — Assessment & Plan Note (Signed)
-   Fall precautions - PT, OT - TOC has been consulted for placement

## 2021-12-12 NOTE — Assessment & Plan Note (Signed)
There was brief concern for hypoxia this morning, but this was due to a faulty plethysmograph, her breathing is comfortable, she is asymptomatic, follow up SpO2 normal on room air, chest imaging normal.

## 2021-12-12 NOTE — Assessment & Plan Note (Signed)
-   BMP in the a.m. 

## 2021-12-12 NOTE — Assessment & Plan Note (Signed)
-   Blood pressure control as above

## 2021-12-12 NOTE — Evaluation (Signed)
Physical Therapy Evaluation Patient Details Name: Olivia Chambers MRN: [id number] DOB: June 12, 1928 Today's Date: 12/12/2021  History of Present Illness  Olivia Chambers is a 88 y.o. female with past medical history of advanced dementia, CKD, diabetes, GERD, hyperlipidemia, hypertension presents with generalized weakness.   Clinical Impression  Pt admitted with above diagnosis. PT/OT co-eval performed due to pt complexity and for pt safety. Daughter present for home lay out, dme, PLOF. Per daughter over the past several weeks to months pt has had progressive decline in mobility. At baseline pt ambulates with a SPC in household. However prior to hospitalization, daughter has had to significantly assist with transfers and mobility due to decline in cognitive state. Pt currently only alert to self. Unsure of baseline cognitive function, however daughter does report pt typically knows her, but this morning pt seemed confused on identifying her daughter today. Significant time attempting to motivate and encourage pt to participate with therapy. Appears pt feet are painful but pt having trouble communicating and expressing what she is feeling. Ultimately relying on pt daughter to participate in mobility efforts for pt to perform with rehab. Overall pt requiring modA+2 and HHA+2 with all mobility to transfer EOB, stand, [facility] with full dependence needed for pericare post urination in standing. Pt requiring modA+2 to return to supine in bed and maxA+2 to scoot up towards HOB. During transfers pt required increased time and max VC's for sequencing LE's towards surfaces transferred to with very minimal foot clearances bilaterally with shuffling type steps. Overall pt has had decline in mobility from [facility] house to supervision to heavy +2 physical assist needed currently. Pt will require STR at discharge due to cognition, weakness, and poor safety awareness. Pt does not have adequate at home  physical assist and equipment to safely return home. Pt currently with functional limitations due to the deficits listed below (see PT Problem List). Pt will benefit from skilled PT to increase their independence and safety with mobility to allow discharge to the venue listed below.      Recommendations for follow up therapy are one component of a multi-disciplinary discharge planning process, led by the attending physician.  Recommendations may be updated based on patient status, additional functional criteria and insurance authorization.  Follow Up Recommendations Skilled nursing-short term rehab (<3 hours/day)    Assistance Recommended at Discharge Frequent or constant Supervision/Assistance  Patient can return home with the following  Direct supervision/assist for financial management;Assistance with cooking/housework;Two people to help with walking and/or transfers;Two people to help with bathing/dressing/bathroom;Direct supervision/assist for medications management;Help with stairs or ramp for entrance;Assist for transportation;Assistance with feeding;A lot of help with bathing/dressing/bathroom    Equipment Recommendations Other (comment) (tbd by next venue of care)  Recommendations for Other Services       Functional Status Assessment Patient has had a recent decline in their functional status and/or demonstrates limited ability to make significant improvements in function in a reasonable and predictable amount of time     Precautions / Restrictions Precautions Precautions: Fall Restrictions Weight Bearing Restrictions: No      Mobility  Bed Mobility Overal bed mobility: Needs Assistance Bed Mobility: Supine to Sit, Sit to Supine     Supine to sit: Mod assist, +2 for physical assistance, +2 for safety/equipment, HOB elevated Sit to supine: Mod assist, +2 for physical assistance, +2 for safety/equipment     Patient Response: Anxious, Cooperative  Transfers Overall  transfer level: Needs assistance Equipment used: 2 person  hand held assist Transfers: Bed to chair/wheelchair/BSC     Step pivot transfers: Mod assist, +2 safety/equipment       General transfer comment: Requiring increased time and max VC's for sequencing steps    Ambulation/Gait       Gait Pattern/deviations: Engineer, structural    Modified Rankin (Stroke Patients Only)       Balance Overall balance assessment: Needs assistance Sitting-balance support: Bilateral upper extremity supported, Feet supported Sitting balance-Leahy Scale: Fair     Standing balance support: Bilateral upper extremity supported, During functional activity Standing balance-Leahy Scale: Poor Standing balance comment: difficulty getting knees into TKE                             Pertinent Vitals/Pain Pain Assessment Pain Assessment: Faces Faces Pain Scale: No hurt    Home Living Family/patient expects to be discharged to:: Private residence Living Arrangements: Children Available Help at Discharge: Family;Available 24 hours/day Type of Home: House Home Access: Stairs to enter;Ramped entrance (ramp at back entrance)       Home Layout: One level Home Equipment: [place] - single point      Prior Function Prior Level of Function : Needs assist  Cognitive Assist : ADLs (cognitive);Mobility (cognitive) Mobility (Cognitive): Step by step cues ADLs (Cognitive): Step by step cues Physical Assist : Mobility (physical);ADLs (physical) Mobility (physical): Bed mobility;Transfers;Gait ADLs (physical): Feeding;Grooming;Bathing;Dressing;Toileting;IADLs Mobility Comments: Pt's daughter reports decline in mobiltiy last few months requiring heavy physical assist for mobility ADLs Comments: Daughter assisting pt in all ADL's     Hand Dominance        Extremity/Trunk Assessment   Upper Extremity Assessment Upper Extremity Assessment:  Generalized weakness    Lower Extremity Assessment Lower Extremity Assessment: Generalized weakness       Communication   Communication: Other (comment) (mumbling, difficulty with word finding.)  Cognition Arousal/Alertness: Awake/alert   Overall Cognitive Status: History of cognitive impairments - at baseline                                          General Comments      Exercises Other Exercises Other Exercises: Role of PT in acute setting, d/c recs to pt daughter   Assessment/Plan    PT Assessment Patient needs continued PT services  PT Problem List Decreased strength;Decreased cognition;Decreased activity tolerance;Decreased balance;Decreased mobility;Decreased safety awareness;Pain       PT Treatment Interventions DME instruction;Therapeutic exercise;Gait training;Balance training;Neuromuscular re-education;Functional mobility training;Therapeutic activities;Patient/family education    PT Goals (Current goals can be found in the Care Plan section)  Acute Rehab PT Goals PT Goal Formulation: Patient unable to participate in goal setting    Frequency Min 2X/week     Co-evaluation PT/OT/SLP Co-Evaluation/Treatment: Yes Reason for Co-Treatment: Complexity of the patient's impairments (multi-system involvement);Necessary to address cognition/behavior during functional activity;For patient/therapist safety;To address functional/ADL transfers PT goals addressed during session: Mobility/safety with mobility;Balance;Strengthening/ROM         AM-PAC PT "6 Clicks" Mobility  Outcome Measure Help needed turning from your back to your side while in a flat bed without using bedrails?: A Lot Help needed moving from lying on your back to sitting on the side of a flat bed without using  bedrails?: A Lot Help needed moving to and from a bed to a chair (including a wheelchair)?: A Lot Help needed standing up from a chair using your arms (e.g., wheelchair or  bedside chair)?: A Lot Help needed to walk in hospital room?: A Lot Help needed climbing 3-5 steps with a railing? : Total 6 Click Score: 11    End of Session Equipment Utilized During Treatment: Gait belt Activity Tolerance: Other (comment);Patient tolerated treatment well (cognitive deficits) Patient left: in bed;with bed alarm set;with family/visitor present Nurse Communication: Mobility status PT Visit Diagnosis: Unsteadiness on feet (R26.81);Other abnormalities of gait and mobility (R26.89);History of falling (Z91.81);Difficulty in walking, not elsewhere classified (R26.2)    Time: 3244-0102 PT Time Calculation (min) (ACUTE ONLY): 55 min   Charges:   PT Evaluation $PT Eval Moderate Complexity: 1 Mod PT Treatments $Therapeutic Activity: 8-22 mins       Brown Dunlap M. Fairly IV, PT, DPT Physical Therapist- [place]  [facility]  12/12/2021, 12:51 PM

## 2021-12-12 NOTE — Plan of Care (Signed)
  Problem: Education: Goal: Knowledge of General Education information will improve Description: Including pain rating scale, medication(s)/side effects and non-pharmacologic comfort measures Outcome: Progressing   Problem: Health Behavior/Discharge Planning: Goal: Ability to manage health-related needs will improve Outcome: Progressing   Problem: Clinical Measurements: Goal: Ability to maintain clinical measurements within normal limits will improve Outcome: Progressing Goal: Will remain free from infection Outcome: Progressing Goal: Diagnostic test results will improve Outcome: Progressing Goal: Respiratory complications will improve Outcome: Progressing Goal: Cardiovascular complication will be avoided Outcome: Progressing   Problem: Activity: Goal: Risk for activity intolerance will decrease Outcome: Progressing   Problem: Coping: Goal: Level of anxiety will decrease Outcome: Progressing   Problem: Safety: Goal: Ability to remain free from injury will improve Outcome: Progressing   

## 2021-12-12 NOTE — Progress Notes (Addendum)
2150: NP Foust notified that patient was agitated and combative when attempting to get vitals and glucose check. Unable to obtain. Instructed to attempt at a later time.   2215: This nurse as well as the nurse tech were able to obtain vitals. Patient continued to remain combative and refused glucose check and medications. NP Foust notified.   2230: Linens were soiled and it took 4 nurses to change patient. Patient was screaming and attempting to pinch, bite and hit nurses. No PRN meds available. NP Foust notified and said she would come to unit to assess patient.   4097: Attempted to perform wound care and reposition patient in the bed. Patient started screaming and trying to hit staff assisting. Unable to change wounds or reposition patient. Patient was clean and dry in the bed, vitals taken. Will pass along to day shift nurse.

## 2021-12-12 NOTE — Progress Notes (Signed)
°  Progress Note   Patient: Olivia Chambers [id number] DOB: 1928/04/10 DOA: 12/11/2021     0 DOS: the patient was seen and examined on 12/12/2021   Brief hospital course: Mrs. Oelke is a 88 yo. F with dementia and aggression, lives at home with daughter, Palliative care involved, SW have recommended Memory Care, DM, CKD IIIb baseline Cr 1.2-1.5, obesity, and HTN who presented with a fall, "weakness" and leg weeping.  Evidently leg swelling has been progressive over months, now so bad she cannot walk, fell tonight.    In the ER, CT head showed traumatic SAH, small.  Discussed with Neurosurgery, no intervention needed.   CT chest showed no edema, but the patient developed hypoxia and was admitted for work up of swelling, hypoxia, weakness/shortness of breath.  Assessment and Plan * Dementia ([place])- (present on admission) There was brief concern for hypoxia this morning, but this was due to a faulty plethysmograph, her breathing is comfortable, she is asymptomatic, follow up SpO2 normal on room air, chest imaging normal.  Subarachnoid hemorrhage following injury- (present on admission) No treatment or follow up necessary   Swelling of lower extremity- (present on admission) Chronic.  BNP normal, Chest imaging without edema or effusions.   - Follow echo  Insulin dependent type 2 diabetes mellitus (HCC) A1c corresponds to an average sugar of 372 mg/dL. Eating well - Start Lantus -Hold 70/30 - Mealtime and sliding scale insulin  Dyslipidemia- (present on admission) - Continue atorvastatin  Chronic kidney disease (CKD), stage III (moderate) (HCC)- (present on admission) Creatinine stable relative to baseline  Benign essential HTN- (present on admission) Pressure normal off medicines - Hold amlodipine due to leg swelling -Resume lisinopril and HCTZ     Subjective: No complaints.  Eating well.  Pleasantly confused.    Objective Vital signs were reviewed and unremarkable. General  appearance: Elderly adult female, sleeping, arousable easily makes eye contact.  Identifies her own name, disoriented to situation or place.  Follows commands and then starts rambling incoherently.  Then drifts back off to sleep.     HEENT: Anicteric, conjunctive are pink, lids and lashes normal.  Lips normal, oropharynx moist, no oral lesions. Skin: No suspicious rashes or lesions Cardiac: RRR, no murmurs, some mild chronic venous insufficiency, no significant pitting edema, she has chronic venous stasis changes some venous stasis ulcers.  No JVD. Respiratory: Normal respiratory rate and rhythm, lungs clear without rales or wheezes. Abdomen: Abdomen soft, no tenderness palpation or guarding.  No hepatosplenomegaly. MSK: Moderately reduced subcutaneous muscle mass and fat. Neuro: Arouses easily, moves upper extremities with generalized weakness but symmetric strength, face symmetric, speech fluent. Psych: Attention diminished, affect normal and pleasant, judgment insight appear severely impaired by dementia.   Data Reviewed:  CT head report shows insignificant subarachnoid hemorrhage.  CBC notable for mild anemia, glucose elevated.  Creatinine 1.12, BNP 76, potassium slightly low.    Disposition: Status is: Observation  The patient does not meet admission criteria, she appears to be at baseline.  Family are not able to provide a safe environment at home.  I am told by transitions of care that they have not yet secured a safe disposition but expect to within a few days.         Author: Edwin Dada, MD 12/12/2021 4:54 PM  For on call review [email or web address].

## 2021-12-12 NOTE — Assessment & Plan Note (Addendum)
Pressure normal off medicines - Hold amlodipine due to leg swelling -Resume lisinopril and HCTZ

## 2021-12-13 DIAGNOSIS — F03C18 Unspecified dementia, severe, with other behavioral disturbance: Secondary | ICD-10-CM | POA: Diagnosis not present

## 2021-12-13 LAB — CBC
HCT: 31.7 % — ABNORMAL LOW (ref 36.0–46.0)
Hemoglobin: 10.2 g/dL — ABNORMAL LOW (ref 12.0–15.0)
MCH: 27.7 pg (ref 26.0–34.0)
MCHC: 32.2 g/dL (ref 30.0–36.0)
MCV: 86.1 fL (ref 80.0–100.0)
Platelets: 119 10*3/uL — ABNORMAL LOW (ref 150–400)
RBC: 3.68 MIL/uL — ABNORMAL LOW (ref 3.87–5.11)
RDW: 14.9 % (ref 11.5–15.5)
WBC: 5.2 10*3/uL (ref 4.0–10.5)
nRBC: 0 % (ref 0.0–0.2)

## 2021-12-13 LAB — GLUCOSE, CAPILLARY
Glucose-Capillary: 143 mg/dL — ABNORMAL HIGH (ref 70–99)
Glucose-Capillary: 270 mg/dL — ABNORMAL HIGH (ref 70–99)
Glucose-Capillary: 166 mg/dL — ABNORMAL HIGH (ref 70–99)
Glucose-Capillary: 109 mg/dL — ABNORMAL HIGH (ref 70–99)

## 2021-12-13 LAB — BASIC METABOLIC PANEL
Anion gap: 8 (ref 5–15)
BUN: 19 mg/dL (ref 8–23)
CO2: 28 mmol/L (ref 22–32)
Calcium: 8.3 mg/dL — ABNORMAL LOW (ref 8.9–10.3)
Chloride: 103 mmol/L (ref 98–111)
Creatinine, Ser: 1.21 mg/dL — ABNORMAL HIGH (ref 0.44–1.00)
GFR, Estimated: 42 mL/min — ABNORMAL LOW (ref >60)
Glucose, Bld: 127 mg/dL — ABNORMAL HIGH (ref 70–99)
Potassium: 3.1 mmol/L — ABNORMAL LOW (ref 3.5–5.1)
Sodium: 139 mmol/L (ref 135–145)

## 2021-12-13 LAB — ECHOCARDIOGRAM COMPLETE
AR max vel: 1.53 cm2
AV Area VTI: 1.54 cm2
AV Area mean vel: 1.34 cm2
AV Mean grad: 13 mmHg
AV Peak grad: 19.5 mmHg
Ao pk vel: 2.21 m/s
Area-P 1/2: 5.88 cm2
Calc EF: 64.3 %
Height: 60 in
MV VTI: 1.7 cm2
Single Plane A2C EF: 56.8 %
Single Plane A4C EF: 69.4 %

## 2021-12-13 LAB — MAGNESIUM: Magnesium: 1.6 mg/dL — ABNORMAL LOW (ref 1.7–2.4)

## 2021-12-13 LAB — PHOSPHORUS: Phosphorus: 2.9 mg/dL (ref 2.5–4.6)

## 2021-12-13 MED ORDER — MAGNESIUM OXIDE -MG SUPPLEMENT 400 (240 MG) MG PO TABS
400.0000 mg | ORAL_TABLET | Freq: Two times a day (BID) | ORAL | Status: DC
  Administered 2021-12-13 – 2021-12-14 (×3): 400 mg via ORAL
  Filled 2021-12-13 (×3): qty 1

## 2021-12-13 MED ORDER — MAGNESIUM SULFATE 2 GM/50ML IV SOLN
2.0000 g | Freq: Once | INTRAVENOUS | Status: DC
  Filled 2021-12-13: qty 50

## 2021-12-13 MED ORDER — HALOPERIDOL 1 MG PO TABS
1.0000 mg | ORAL_TABLET | Freq: Three times a day (TID) | ORAL | Status: AC | PRN
  Administered 2021-12-13 – 2021-12-14 (×2): 1 mg via ORAL
  Filled 2021-12-13 (×3): qty 1

## 2021-12-13 MED ORDER — HALOPERIDOL LACTATE 5 MG/ML IJ SOLN: 1.0000 mg | Freq: Three times a day (TID) | INTRAMUSCULAR | Status: AC | PRN

## 2021-12-13 MED ORDER — POTASSIUM CHLORIDE CRYS ER 20 MEQ PO TBCR
30.0000 meq | EXTENDED_RELEASE_TABLET | Freq: Three times a day (TID) | ORAL | Status: AC
  Administered 2021-12-13 (×2): 30 meq via ORAL
  Filled 2021-12-13 (×2): qty 1

## 2021-12-13 MED ORDER — OXYCODONE HCL 5 MG PO TABS
5.0000 mg | ORAL_TABLET | Freq: Four times a day (QID) | ORAL | Status: DC | PRN
  Administered 2021-12-13 – 2021-12-14 (×2): 5 mg via ORAL
  Filled 2021-12-13 (×2): qty 1

## 2021-12-13 MED ORDER — POTASSIUM CHLORIDE CRYS ER 20 MEQ PO TBCR: 40.0000 meq | EXTENDED_RELEASE_TABLET | Freq: Once | ORAL | Status: DC

## 2021-12-13 MED ORDER — METOPROLOL TARTRATE 25 MG PO TABS
12.5000 mg | ORAL_TABLET | Freq: Two times a day (BID) | ORAL | Status: DC
  Administered 2021-12-13 – 2021-12-14 (×2): 12.5 mg via ORAL
  Filled 2021-12-13 (×2): qty 1

## 2021-12-13 NOTE — Progress Notes (Addendum)
[facility] Progress Note  Patient: Olivia Chambers    [id number]  DOA: 12/11/2021     Date of Service: the patient was seen and examined on 12/13/2021  Chief Complaint  Patient presents with   Weakness   Brief hospital course: Mrs. Nance is a 88 yo. F with dementia and aggression, lives at home with daughter, Palliative care involved, SW have recommended Memory Care, DM, CKD IIIb baseline Cr 1.2-1.5, obesity, and HTN who presented with a fall, "weakness" and leg weeping.   Evidently leg swelling has been progressive over months, now so bad she cannot walk, fell tonight.     In the ER, CT head showed traumatic SAH, small.  Discussed with Neurosurgery, no intervention needed.   CT chest showed no edema, but the patient developed hypoxia and was admitted for work up of swelling, hypoxia, weakness/shortness of breath.   Assessment and Plan: Dementia ([place])- (present on admission) Patient is AO x2, does not remember why she is here in the hospital.    Subarachnoid hemorrhage following injury- (present on admission) No treatment or follow up necessary    Swelling of lower extremity- (present on admission) Chronic.  BNP normal, Chest imaging without edema or effusions.   echo LVEF 65 to 70%, no wall motion abnormality, grade 1 diastolic dysfunction   Insulin dependent type 2 diabetes mellitus (HCC) A1c corresponds to an average sugar of 372 mg/dL.  Hemoglobin A1c 14.6, uncontrolled diabetes Eating well - Start Lantus -Hold 70/30 - Mealtime and sliding scale insulin   Dyslipidemia- (present on admission) - Continue atorvastatin   Chronic kidney disease (CKD), stage III (moderate) (HCC)- (present on admission) Creatinine stable relative to baseline   Benign essential HTN- (present on admission) Pressure normal off medicines - Hold amlodipine due to leg swelling and discontinued HCTZ due to hypokalemia -Resume lisinopril Started Lopressor 1205 mg p.o. twice daily with  holding parameters to keep systolic BP around [id number]   Hypokalemia most likely due to HCTZ which has been discontinued Potassium repleted, continue to monitor and replete as needed.  Hypomagnesemia, mag repleted.      Body mass index is 41.79 kg/m.  Nutrition Problem: Increased nutrient needs Etiology: wound healing Interventions: Interventions: MVI, Juven     Diet: Heart healthy/carb modified DVT Prophylaxis: SCD, pharmacological prophylaxis contraindicated due to subarachnoid hemorrhage    Advance goals of care discussion: Full code  Family Communication: family was not present at bedside, at the time of interview.  The pt provided permission to discuss medical plan with the family. Opportunity was given to ask question and all questions were answered satisfactorily.   Disposition:  Pt is from home with palliative care, admitted with fall, subdural hemorrhage, dementia, family is unable to provide care, still unable to take care of herself and no family support, which precludes a safe discharge. Discharge to SNF, when bed available.  Subjective: No significant events overnight, patient denies any active issues, no any chest pain or pressure, no shortness of breath.   Physical Exam: General:  alert oriented to place and person.  Appear in no distress, affect appropriate Eyes: PERRLA ENT: Oral Mucosa Clear, moist  Neck: no JVD,  Cardiovascular: S1 and S2 Present, no Murmur,  Respiratory: good respiratory effort, Bilateral Air entry equal and Decreased, no Crackles, no wheezes Abdomen: Bowel Sound present, Soft and no tenderness,  Skin: no rashes Extremities: no Pedal edema, no calf tenderness Neurologic: without any new focal findings Gait not checked due to patient safety concerns  Vitals:   12/13/21 0635 12/13/21 0638 12/13/21 0731 12/13/21 1149  BP: (!) 142/53  (!) 130/49 135/62  Pulse: 72  73 77  Resp: 20  18 18   Temp:  98.5 F (36.9 C) 98 F (36.7 C) 98 F  (36.7 C)  TempSrc:   Oral Oral  SpO2: 96%  96% 100%  Height:        Intake/Output Summary (Last 24 hours) at 12/13/2021 1239 Last data filed at 12/13/2021 1011 Gross per 24 hour  Intake 720 ml  Output --  Net 720 ml   There were no vitals filed for this visit.  Data Reviewed: I have personally reviewed and interpreted daily labs, tele strips, imagings as discussed above. I reviewed all nursing notes, pharmacy notes, vitals, pertinent old records I have discussed plan of care as described above with RN and patient/family.  CBC: Recent Labs  Lab 12/11/21 1357 12/11/21 2328 12/13/21 0914  WBC 4.1 3.9* 5.2  HGB 11.4* 11.1* 10.2*  HCT 35.6* 34.5* 31.7*  MCV 86.4 85.2 86.1  PLT 110* 113* 123456*   Basic Metabolic Panel: Recent Labs  Lab 12/11/21 1357 12/11/21 2328 12/12/21 0418 12/13/21 0914  NA 135  --  138 139  K 4.4  --  3.4* 3.1*  CL 100  --  102 103  CO2 25  --  26 28  GLUCOSE 490*  --  201* 127*  BUN 28*  --  19 19  CREATININE 1.49*  --  1.12* 1.21*  CALCIUM 8.8*  --  8.6* 8.3*  MG  --  1.7  --  1.6*  PHOS  --  2.6  --  2.9    Studies: ECHOCARDIOGRAM COMPLETE  Result Date: 12/13/2021    ECHOCARDIOGRAM REPORT   Patient Name:   EVELYNNE GODAR Date of Exam: 12/12/2021 Medical Rec #:  [id number]      Height:       60.0 in Accession #:    [id number]     Weight:       214.0 lb Date of Birth:  1928-04-21      BSA:          1.921 m Patient Age:    80 years       BP:           144/114 mmHg Patient Gender: F              HR:           88 bpm. Exam Location:  [facility] Procedure: 2D Echo, Color Doppler and Cardiac Doppler Indications:     R06.03 Acute respiratory distress  History:         Patient has no prior history of Echocardiogram examinations.                  CKD stage 3; Risk Factors:Hypertension, Diabetes and                  Dyslipidemia.  Sonographer:     Charmayne Sheer Referring Phys:  [id number] Suann Larry DANFORD Diagnosing Phys: Serafina Royals MD  Sonographer Comments:  Suboptimal parasternal window. TDS due to mental confusion of pt and inability to understand nature of test, Image acquisition challenging due to patient body habitus and Image acquisition challenging due to uncooperative patient. IMPRESSIONS  1. Left ventricular ejection fraction, by estimation, is 65 to 70%. The left ventricle has normal function. The left ventricle has no regional wall motion abnormalities. Left ventricular diastolic parameters are  consistent with Grade I diastolic dysfunction (impaired relaxation).  2. Right ventricular systolic function is normal. The right ventricular size is normal.  3. The mitral valve is normal in structure. Trivial mitral valve regurgitation.  4. The aortic valve is normal in structure. Aortic valve regurgitation is not visualized. FINDINGS  Left Ventricle: Left ventricular ejection fraction, by estimation, is 65 to 70%. The left ventricle has normal function. The left ventricle has no regional wall motion abnormalities. The left ventricular internal cavity size was normal in size. There is  no left ventricular hypertrophy. Left ventricular diastolic parameters are consistent with Grade I diastolic dysfunction (impaired relaxation). Right Ventricle: The right ventricular size is normal. No increase in right ventricular wall thickness. Right ventricular systolic function is normal. Left Atrium: Left atrial size was normal in size. Right Atrium: Right atrial size was normal in size. Pericardium: There is no evidence of pericardial effusion. Mitral Valve: The mitral valve is normal in structure. Trivial mitral valve regurgitation. MV peak gradient, 10.8 mmHg. The mean mitral valve gradient is 4.0 mmHg. Tricuspid Valve: The tricuspid valve is normal in structure. Tricuspid valve regurgitation is trivial. Aortic Valve: The aortic valve is normal in structure. Aortic valve regurgitation is not visualized. Aortic valve mean gradient measures 13.0 mmHg. Aortic valve peak gradient  measures 19.5 mmHg. Aortic valve area, by VTI measures 1.54 cm. Pulmonic Valve: The pulmonic valve was normal in structure. Pulmonic valve regurgitation is not visualized. Aorta: The aortic root and ascending aorta are structurally normal, with no evidence of dilitation. IAS/Shunts: No atrial level shunt detected by color flow Doppler.  LEFT VENTRICLE PLAX 2D LVOT diam:     2.10 cm     Diastology LV SV:         60          LV e' lateral:   9.25 cm/s LV SV Index:   31          LV E/e' lateral: 8.0 LVOT Area:     3.46 cm  LV Volumes (MOD) LV vol d, MOD A2C: 47.5 ml LV vol d, MOD A4C: 99.7 ml LV vol s, MOD A2C: 20.5 ml LV vol s, MOD A4C: 30.5 ml LV SV MOD A2C:     27.0 ml LV SV MOD A4C:     99.7 ml LV SV MOD BP:      47.6 ml RIGHT VENTRICLE RV Basal diam:  3.94 cm LEFT ATRIUM             Index        RIGHT ATRIUM           Index LA Vol (A2C):   36.6 ml 19.05 ml/m  RA Area:     14.80 cm LA Vol (A4C):   35.0 ml 18.22 ml/m  RA Volume:   35.60 ml  18.53 ml/m LA Biplane Vol: 36.7 ml 19.10 ml/m  AORTIC VALVE AV Area (Vmax):    1.53 cm AV Area (Vmean):   1.34 cm AV Area (VTI):     1.54 cm AV Vmax:           221.00 cm/s AV Vmean:          176.000 cm/s AV VTI:            0.391 m AV Peak Grad:      19.5 mmHg AV Mean Grad:      13.0 mmHg LVOT Vmax:         97.40 cm/s LVOT Vmean:  68.100 cm/s LVOT VTI:          0.174 m LVOT/AV VTI ratio: 0.45  AORTA Ao Root diam: 2.60 cm MITRAL VALVE MV Area (PHT): 5.88 cm     SHUNTS MV Area VTI:   1.70 cm     Systemic VTI:  0.17 m MV Peak grad:  10.8 mmHg    Systemic Diam: 2.10 cm MV Mean grad:  4.0 mmHg MV Vmax:       1.64 m/s MV Vmean:      89.0 cm/s MV Decel Time: 129 msec MV E velocity: 74.10 cm/s MV A velocity: 148.00 cm/s MV E/A ratio:  0.50 Serafina Royals MD Electronically signed by Serafina Royals MD Signature Date/Time: 12/13/2021/9:06:13 AM    Final     Scheduled Meds:  atorvastatin  40 mg Oral QHS   donepezil  10 mg Oral QHS   hydrocerin   Topical Daily    hydrochlorothiazide  12.5 mg Oral Daily   insulin aspart  0-15 Units Subcutaneous TID WC   insulin aspart  0-5 Units Subcutaneous QHS   insulin glargine-yfgn  22 Units Subcutaneous Daily   lisinopril  10 mg Oral Daily   multivitamin with minerals  1 tablet Oral Daily   nutrition supplement (JUVEN)  1 packet Oral BID BM   Continuous Infusions: PRN Meds: acetaminophen **OR** acetaminophen, fluticasone, labetalol, ondansetron **OR** ondansetron (ZOFRAN) IV  Time spent: 35 minutes  Author: Val Riles. MD [facility] 12/13/2021 12:39 PM  To reach On-call, see care teams to locate the attending and reach out to them via [email or web address]. If 7PM-7AM, please contact night-coverage If you still have difficulty reaching the attending provider, please page the [facility] (Director on Call) for [facility] on amion for assistance.

## 2021-12-13 NOTE — Progress Notes (Signed)
[facility] Liaison note:  This patient is currently enrolled in [facility] outpatient-based Palliative Care. Will continue to follow for disposition.  Please call with any outpatient palliative questions or concerns.  Thank you, Dee Curry, LPN [facility] Hospital Liaison [phone] 

## 2021-12-13 NOTE — Progress Notes (Signed)
Attempted to start IV to accomodate order for IV magnesium. Patient refusing to let RN start IV at this time. Attempting to hit RN while assessing arm for a spot to insert IV. RN attempted to change B/L dressings on legs. Patient hitting RN and screaming at RN to stop touching her. RN was able to get a new dressing on the right leg, but unable to redress the left leg due to patient's aggressive behaviors.

## 2021-12-13 NOTE — Progress Notes (Signed)
Occupational Therapy Treatment Patient Details Name: [name]: [id number] DOB: 01-26-1928 Today's Date: 12/13/2021   History of present illness Olivia Chambers is a 88 y.o. female with past medical history of advanced dementia, CKD, diabetes, GERD, hyperlipidemia, hypertension presents with generalized weakness.   OT comments  Pt seen for OT tx session. She presents moderately more confused/agitated from previous date. No family present at bedside this date. RN in room attempting to start IV/change BLE bandages. Therapist attempts to facilitate UB grooming task with goal of engaging pt in functional activity to minimize attention to medical procedure. Pt initially agreeable and requests assistance to wash her face. MAX assist provided for thoroughness during UB grooming tasks. Pt becomes more agitated and disengages from functional tasks when RN attempts to place IV. She is unsafe to attempt functional mobility this date. Pt is progressing toward OT goals and continues to benefit from skilled OT services. Will continue to follow POC as written. Continue to recommend STR upon acute hospital DC.    Recommendations for follow up therapy are one component of a multi-disciplinary discharge planning process, led by the attending physician.  Recommendations may be updated based on patient status, additional functional criteria and insurance authorization.    Follow Up Recommendations  Skilled nursing-short term rehab (<3 hours/day)    Assistance Recommended at Discharge Frequent or constant Supervision/Assistance  Patient can return home with the following  Two people to help with walking and/or transfers;Two people to help with bathing/dressing/bathroom;Assistance with cooking/housework;Assistance with feeding;Help with stairs or ramp for entrance;Assist for transportation;Direct supervision/assist for financial management;Direct supervision/assist for medications management   Equipment  Recommendations  BSC/3in1    Recommendations for Other Services      Precautions / Restrictions Precautions Precautions: Fall Restrictions Weight Bearing Restrictions: No Other Position/Activity Restrictions: BLE edema/wounds.       Mobility Bed Mobility               General bed mobility comments: Deferred.    Transfers                   General transfer comment: Deferred.     Balance                                           ADL either performed or assessed with clinical judgement   ADL Overall ADL's : Needs assistance/impaired                                       General ADL Comments: Pt continues to be significantly functionally limited by generalized weakness, decreased activity tolerance, increased pain in BLE, and decreased cognition. She requires +2 MOD-MAX A for all bed/functional mobility. +2 MOD for step pivot transfer to/from [facility] back to bed. MAX +2 for sup>sit t/f. MAX +2 for standing peri-care. With consistent cueing for safety/sequencing t/o functional tasks.    Extremity/Trunk Assessment Upper Extremity Assessment Upper Extremity Assessment: Generalized weakness   Lower Extremity Assessment Lower Extremity Assessment: Generalized weakness        Vision       Perception     Praxis      Cognition Arousal/Alertness: Awake/alert   Overall Cognitive Status: History of cognitive impairments - at baseline  General Comments: Pt more agitated/combativie this date. Less agreeable to participating in therapeutic activities. Requires constant reassurance/redirection.        Exercises Other Exercises Other Exercises: OT facilitated UB grooming task while RN attempts to complete medical tasks including starting IV, and changing BLE bandages. Attempted to use functional activity as distraction technique to failitate medical proceedures with limited success. Pt  more combative this date. Responds well to positive approach strategies.    Shoulder Instructions       General Comments      Pertinent Vitals/ Pain       Pain Assessment Faces Pain Scale: Hurts a little bit Breathing: occasional labored breathing, short period of hyperventilation Negative Vocalization: repeated troubled calling out, loud moaning/groaning, crying Facial Expression: facial grimacing Body Language: rigid, fists clenched, knees up, pushing/pulling away, strikes out Consolability: unable to console, distract or reassure PAINAD Score: 9 Pain Location: Pt grimaces, pulls away, and yells out when BLE touched. Is unable to verbalize level of pain or localize. Pain Descriptors / Indicators: Grimacing, Guarding Pain Intervention(s): Limited activity within patient's tolerance, Monitored during session (RN notified)  Home Living                                          Prior Functioning/Environment              Frequency  Min 2X/week        Progress Toward Goals  OT Goals(current goals can now be found in the care plan section)     Acute Rehab OT Goals Patient Stated Goal: To go to rehab OT Goal Formulation: With family Time For Goal Achievement: 12/26/21 Potential to Achieve Goals: Good  Plan      Co-evaluation                 AM-PAC OT "6 Clicks" Daily Activity     Outcome Measure   Help from another person eating meals?: A Little Help from another person taking care of personal grooming?: A Little Help from another person toileting, which includes using toliet, bedpan, or urinal?: Total Help from another person bathing (including washing, rinsing, drying)?: A Lot Help from another person to put on and taking off regular upper body clothing?: A Lot Help from another person to put on and taking off regular lower body clothing?: A Lot 6 Click Score: 13    End of Session    OT Visit Diagnosis: Other abnormalities of gait  and mobility (R26.89);Muscle weakness (generalized) (M62.81)   Activity Tolerance Treatment limited secondary to agitation   Patient Left in bed;with call bell/phone within reach;with bed alarm set;with family/visitor present;with nursing/sitter in room   Nurse Communication          Time: 1419-1440 OT Time Calculation (min): 21 min  Charges: OT General Charges $OT Visit: 1 Visit OT Treatments $Self Care/Home Management : 8-22 mins  Rockney Ghee, M.S., OTR/L Feeding Team - [facility] Ascom: [phone] 12/13/21, 4:00 PM

## 2021-12-13 NOTE — TOC Initial Note (Signed)
Transition of Care [facility]) - Initial/Assessment Note    Patient Details  Name: Olivia Chambers MRN: [id number] Date of Birth: [birth year]  Transition [facility]) CM/SW Contact:    Pete Pelt, RN Phone Number: 12/13/2021, 2:07 PM  Clinical Narrative:   [name] daughter states that patient falls a lot  at home.  Daughter has caregivers at home.  Daughter states that she will take patient home with home health and DME.  She will also look into Memory Care for patient.  Offered financial services,to assist with medicaid for LTC, daughter refused.     Home Health ordered for patient, including hospital bed and 3 n 1 to assist daughter.    Information about respite and memory care, as well as information about Alzheimer's and other caregiver support groups/systems will be provided on discharge to assist patient with caregiving.           Expected Discharge Plan: [place] Barriers to Discharge: Continued Medical Work up   Patient Goals and CMS Choice        Expected Discharge Plan and Services Expected Discharge Plan: [place]   Discharge Planning Services: CM Consult   Living arrangements for the past 2 months: Single Family Home                                      Prior Living Arrangements/Services Living arrangements for the past 2 months: Single Family Home Lives with:: Self, Adult Children Patient language and need for interpreter reviewed:: Yes (spoke to daughter, patient confused)        Need for Family Participation in Patient Care: Yes (Comment) Care giver support system in place?: Yes (comment)   Criminal Activity/Legal Involvement Pertinent to Current Situation/Hospitalization: No - Comment as needed  Activities of Daily Living      Permission Sought/Granted Permission sought to share information with : Case Manager Permission granted to share information with : Yes, Verbal Permission Granted     Permission  granted to share info w AGENCY: home health and DME        Emotional Assessment Appearance:: Appears stated age       Alcohol / Substance Use: Not Applicable Psych Involvement: No (comment)  Admission diagnosis:  Leg swelling [M79.89] Frequent falls [R29.6] Fall, initial encounter [W19.XXXA] Traumatic subarachnoid hemorrhage with unknown loss of consciousness status, initial encounter [S06.6XAA] Patient Active Problem List   Diagnosis Date Noted   Swelling of lower extremity 12/12/2021   Other skin changes 12/12/2021   Frequent falls 12/11/2021   Subarachnoid hemorrhage following injury 12/11/2021   Insulin dependent type 2 diabetes mellitus ([place]) 12/11/2021   Dementia ([place]) 12/11/2021   Hyperglycemia 12/11/2021   Diabetes mellitus with diabetic retinopathy 09/23/2015   Hearing loss sensory, bilateral 09/10/2015   Vertigo 08/30/2015   Tinnitus 0000000   Systolic ejection murmur 0000000   Cerebral vascular disease 08/23/2015   Retinopathy, background, nonproliferative, mild 07/05/2015   Benign essential HTN 05/22/2015   Chronic kidney disease (CKD), stage III (moderate) ([place]) 05/22/2015   Dyslipidemia 05/22/2015   Obesity, diabetes, and hypertension syndrome ([place]) 05/22/2015   Osteoarthrosis, unspecified whether generalized or localized, involving lower leg 05/22/2015   Allergic rhinitis 05/22/2015   Diabetes mellitus with renal manifestation ([place]) 05/22/2015   Vitamin D deficiency 05/22/2015   Gastro-esophageal reflux disease without esophagitis 04/28/2007   PCP:  Dion Body, MD  Pharmacy:   [facility] [place], [place] [address] Phone: [phone] Fax: [phone]     Social Determinants of Health (SDOH) Interventions    Readmission Risk Interventions No flowsheet data found.

## 2021-12-14 DIAGNOSIS — F03C18 Unspecified dementia, severe, with other behavioral disturbance: Secondary | ICD-10-CM | POA: Diagnosis not present

## 2021-12-14 LAB — GLUCOSE, CAPILLARY
Glucose-Capillary: 122 mg/dL — ABNORMAL HIGH (ref 70–99)
Glucose-Capillary: 238 mg/dL — ABNORMAL HIGH (ref 70–99)

## 2021-12-14 LAB — BASIC METABOLIC PANEL
Anion gap: 5 (ref 5–15)
BUN: 22 mg/dL (ref 8–23)
CO2: 28 mmol/L (ref 22–32)
Calcium: 8.3 mg/dL — ABNORMAL LOW (ref 8.9–10.3)
Chloride: 108 mmol/L (ref 98–111)
Creatinine, Ser: 1.3 mg/dL — ABNORMAL HIGH (ref 0.44–1.00)
GFR, Estimated: 39 mL/min — ABNORMAL LOW (ref >60)
Glucose, Bld: 115 mg/dL — ABNORMAL HIGH (ref 70–99)
Potassium: 4.1 mmol/L (ref 3.5–5.1)
Sodium: 141 mmol/L (ref 135–145)

## 2021-12-14 LAB — CBC
HCT: 30.9 % — ABNORMAL LOW (ref 36.0–46.0)
Hemoglobin: 9.8 g/dL — ABNORMAL LOW (ref 12.0–15.0)
MCH: 27.8 pg (ref 26.0–34.0)
MCHC: 31.7 g/dL (ref 30.0–36.0)
MCV: 87.5 fL (ref 80.0–100.0)
Platelets: 112 10*3/uL — ABNORMAL LOW (ref 150–400)
RBC: 3.53 MIL/uL — ABNORMAL LOW (ref 3.87–5.11)
RDW: 15.2 % (ref 11.5–15.5)
WBC: 5.8 10*3/uL (ref 4.0–10.5)
nRBC: 0 % (ref 0.0–0.2)

## 2021-12-14 LAB — PHOSPHORUS: Phosphorus: 2.7 mg/dL (ref 2.5–4.6)

## 2021-12-14 LAB — MAGNESIUM: Magnesium: 1.8 mg/dL (ref 1.7–2.4)

## 2021-12-14 MED ORDER — MORPHINE SULFATE (PF) 2 MG/ML IV SOLN: 2.0000 mg | INTRAVENOUS | Status: DC | PRN

## 2021-12-14 MED ORDER — AMLODIPINE BESYLATE 10 MG PO TABS
10.0000 mg | ORAL_TABLET | Freq: Every day | ORAL | Status: DC
  Administered 2021-12-14: 15:00:00 10 mg via ORAL
  Filled 2021-12-14: qty 1

## 2021-12-14 MED ORDER — ACETAMINOPHEN 325 MG PO TABS: 650.0000 mg | ORAL_TABLET | Freq: Four times a day (QID) | ORAL | Status: AC | PRN

## 2021-12-14 NOTE — Progress Notes (Signed)
Patient refused all attempts at noon vitals. Combative and yelling at staff members.

## 2021-12-14 NOTE — Plan of Care (Signed)
  Problem: Education: Goal: Knowledge of General Education information will improve Description: Including pain rating scale, medication(s)/side effects and non-pharmacologic comfort measures Outcome: Progressing   Problem: Health Behavior/Discharge Planning: Goal: Ability to manage health-related needs will improve Outcome: Progressing   Problem: Clinical Measurements: Goal: Ability to maintain clinical measurements within normal limits will improve Outcome: Progressing Goal: Will remain free from infection Outcome: Progressing Goal: Diagnostic test results will improve Outcome: Progressing Goal: Respiratory complications will improve Outcome: Progressing Goal: Cardiovascular complication will be avoided Outcome: Progressing   Problem: Activity: Goal: Risk for activity intolerance will decrease Outcome: Progressing   Problem: Coping: Goal: Level of anxiety will decrease Outcome: Progressing   Problem: Safety: Goal: Ability to remain free from injury will improve Outcome: Progressing   

## 2021-12-14 NOTE — Discharge Summary (Signed)
[facility] Discharge Summary   Patient: Olivia Chambers WYS:[id number]  PCP: Dion Body, MD  Date of admission: 12/11/2021   Date of discharge:  12/14/2021     Discharge Diagnoses:  Principal Problem:   Dementia ([place]) Active Problems:   Benign essential HTN   Chronic kidney disease (CKD), stage III (moderate) (HCC)   Dyslipidemia   Gastro-esophageal reflux disease without esophagitis   Obesity, diabetes, and hypertension syndrome (HCC)   Diabetes mellitus with renal manifestation (HCC)   Hearing loss sensory, bilateral   Frequent falls   Subarachnoid hemorrhage following injury   Insulin dependent type 2 diabetes mellitus (HCC)   Swelling of lower extremity   Admitted From: Home Disposition:  Home with Hospice   Recommendations for Outpatient Follow-up:  Hospice care at home Follow up LABS/TEST: None   Diet recommendation: Dysphagia type 1 with thin liquid  Activity: The patient is advised to gradually reintroduce usual activities, as tolerated  Discharge Condition: stable  Code Status: DNR   History of present illness: As per the H and P dictated on admission Hospital Course:  [name] is a 88 yo. F with dementia and aggression, lives at home with daughter, Palliative care involved, SW have recommended Memory Care, DM, CKD IIIb baseline Cr 1.2-1.5, obesity, and HTN who presented with a fall, "weakness" and leg weeping. Evidently leg swelling has been progressive over months, now so bad she cannot walk, fell tonight.   In the ER, CT head showed traumatic SAH, small.  Discussed with Neurosurgery, no intervention needed.   CT chest showed no edema, but the patient developed hypoxia and was admitted for work up of swelling, hypoxia, weakness/shortness of breath. Assessment and Plan: Dementia ([place])- (present on admission) Patient is AO x2, does not remember why she is here in the hospital. Subarachnoid hemorrhage following injury- (present on admission) No  treatment or follow up necessary  Swelling of lower extremity- (present on admission) Chronic.  BNP normal, Chest imaging without edema or effusions.   echo LVEF 65 to 70%, no wall motion abnormality, grade 1 diastolic dysfunction Insulin dependent type 2 diabetes mellitus (HCC) A1c corresponds to an average sugar of 372 mg/dL.  Hemoglobin A1c 14.6, uncontrolled diabetes.  Continue home dose insulin and monitor FSBG.  Overall poor prognosis Dyslipidemia- (present on admission), Continue atorvastatin Chronic kidney disease (CKD), stage III (moderate) ([place])- (present on admission) Creatinine stable relative to baseline Benign essential HTN- (present on admission), continued amlodipine, discontinued lisinopril, hydrochlorothiazide and Lasix. Hypokalemia most likely due to HCTZ which has been discontinued, Potassium repleted,  Hypomagnesemia, mag repleted. Body mass index is 41.79 kg/m.  Nutrition Problem: Increased nutrient needs Etiology: wound healing Nutrition Interventions: Interventions: MVI, Juven  Overall long-term prognosis is poor, discussed with patient's daughter regarding goals of care, she agreed with the DNR DNI and hospice care at home.  Treatments will be arranged and patient will be discharged to home with hospice care today.    On the day of the discharge the patient's vitals were stable, and no other acute medical condition were reported by patient. the patient was felt safe to be discharge at Home with Hospice care.  Consultants: None Procedures: None  Discharge Exam: General: Appear in no distress, no Rash; Oral Mucosa Clear, dry. Cardiovascular: S1 and S2 Present, no Murmur, Respiratory: normal respiratory effort, Bilateral Air entry present and no Crackles, no wheezes Abdomen: Bowel Sound present, Soft and no tenderness, no hernia Extremities: no Pedal edema, no calf tenderness Neurology: lethargic and  not oriented to time, place, and person affect flat in  affect.  There were no vitals filed for this visit. Vitals:   12/14/21 0500 12/14/21 0808  BP: 140/68 (!) 151/61  Pulse: 77 62  Resp: 20 20  Temp: 98.8 F (37.1 C) 98.2 F (36.8 C)  SpO2: 98% 98%    DISCHARGE MEDICATION: Allergies as of 12/14/2021       Reactions   Banana Anaphylaxis   Pioglitazone    pt states causes headaches   Sitagliptin Other (See Comments)   Shrimp [shellfish Allergy] Hives, Rash        Medication List     STOP taking these medications    aspirin 81 MG chewable tablet   furosemide 20 MG tablet Commonly known as: LASIX   hydrochlorothiazide 25 MG tablet Commonly known as: HYDRODIURIL   lisinopril 40 MG tablet Commonly known as: ZESTRIL       TAKE these medications    acetaminophen 325 MG tablet Commonly known as: TYLENOL Take 2 tablets (650 mg total) by mouth every 6 (six) hours as needed for mild pain, fever or headache (or Fever >/= 101).   amLODipine 10 MG tablet Commonly known as: NORVASC Take 10 mg by mouth daily. What changed: Another medication with the same name was removed. Continue taking this medication, and follow the directions you see here.   atorvastatin 40 MG tablet Commonly known as: LIPITOR Take 1 tablet (40 mg total) by mouth daily.   B-D SINGLE USE SWABS REGULAR Pads   donepezil 10 MG tablet Commonly known as: ARICEPT Take 10 mg by mouth at bedtime.   glucose blood test strip Commonly known as: True Metrix Blood Glucose Test Twice daily   NovoLIN 70/30 (70-30) 100 UNIT/ML injection Generic drug: insulin NPH-regular Human Inject 14-18 Units into the skin 2 (two) times daily before a meal. Inject 18 units before morning meal and 14 units before supper meal.   True Metrix Meter w/Device Kit 1 Units by Does not apply route daily.   TRUEplus Lancets 33G Misc               Durable Medical Equipment  (From admission, onward)           Start     Ordered   12/14/21 1046  For home use only  DME Hospital bed  Once       Question Answer Comment  Length of Need Lifetime   Bed type Semi-electric      12/14/21 1046   12/13/21 1438  For home use only DME 3 n 1  Once        12/13/21 1437              Discharge Care Instructions  (From admission, onward)           Start     Ordered   12/14/21 0000  Discharge wound care:       Comments: Continue wound care, and frequent turning every 2 hourly   12/14/21 1338           Allergies  Allergen Reactions   Banana Anaphylaxis   Pioglitazone     pt states causes headaches   Sitagliptin Other (See Comments)   Shrimp [Shellfish Allergy] Hives and Rash   Discharge Instructions     Call MD for:  difficulty breathing, headache or visual disturbances   Complete by: As directed    Call MD for:  extreme fatigue   Complete by: As  directed    Call MD for:  severe uncontrolled pain   Complete by: As directed    Call MD for:  temperature >100.4   Complete by: As directed    Diet - low sodium heart healthy   Complete by: As directed    Discharge instructions   Complete by: As directed    Follow with hospice care for further management   Discharge wound care:   Complete by: As directed    Continue wound care, and frequent turning every 2 hourly   Increase activity slowly   Complete by: As directed        The results of significant diagnostics from this hospitalization (including imaging, microbiology, ancillary and laboratory) are listed below for reference.    Significant Diagnostic Studies: CT HEAD WO CONTRAST (5MM)  Result Date: 12/11/2021 CLINICAL DATA:  Bilateral lower extremity edema and weakness. EXAM: CT HEAD WITHOUT CONTRAST TECHNIQUE: Contiguous axial images were obtained from the base of the skull through the vertex without intravenous contrast. RADIATION DOSE REDUCTION: This exam was performed according to the departmental dose-optimization program which includes automated exposure control, adjustment  of the mA and/or kV according to patient size and/or use of iterative reconstruction technique. COMPARISON:  November 08, 2020 FINDINGS: Brain: There is mild cerebral atrophy with widening of the extra-axial spaces and ventricular dilatation. There are areas of decreased attenuation within the white matter tracts of the supratentorial brain, consistent with microvascular disease changes. A small amount of acute subarachnoid blood is seen within the right occipital lobe (axial CT image 16 and 17, CT series 2). There is no evidence of associated mass effect or midline shift. Vascular: No hyperdense vessel or unexpected calcification. Skull: Normal. Negative for fracture or focal lesion. Sinuses/Orbits: A chronic deformity is seen along the medial wall of the right orbit. Other: None. IMPRESSION: Small amount of acute subarachnoid blood within the right occipital lobe. Electronically Signed   By: Virgina Norfolk M.D.   On: 12/11/2021 19:38   CT Cervical Spine Wo Contrast  Result Date: 12/11/2021 CLINICAL DATA:  Weakness. EXAM: CT CERVICAL SPINE WITHOUT CONTRAST TECHNIQUE: Multidetector CT imaging of the cervical spine was performed without intravenous contrast. Multiplanar CT image reconstructions were also generated. RADIATION DOSE REDUCTION: This exam was performed according to the departmental dose-optimization program which includes automated exposure control, adjustment of the mA and/or kV according to patient size and/or use of iterative reconstruction technique. COMPARISON:  November 08, 2020 FINDINGS: Alignment: Normal. Skull base and vertebrae: No acute fracture. No primary bone lesion or focal pathologic process. Soft tissues and spinal canal: No prevertebral fluid or swelling. No visible canal hematoma. Disc levels: Marked severity endplate sclerosis is seen at the levels of C3-C4, C4-C5, C5-C6 and C6-C7. There is moderate severity narrowing of the anterior atlantoaxial articulation. Marked severity  intervertebral disc space narrowing is seen at the levels of C3-C4, C4-C5, C5-C6 and C6-C7. Mild intervertebral disc space narrowing is present at C2-C3. Bilateral mild-to-moderate severity multilevel facet joint hypertrophy is noted. Upper chest: Negative. Other: None. IMPRESSION: 1. No acute fracture or subluxation of the cervical spine. 2. Marked severity multilevel degenerative changes, as described above. Electronically Signed   By: Virgina Norfolk M.D.   On: 12/11/2021 19:46   US Venous Img Lower Bilateral (DVT)  Result Date: 12/12/2021 CLINICAL DATA:  Left thigh pain and edema.  Evaluate for DVT. EXAM: LEFT LOWER EXTREMITY VENOUS DOPPLER ULTRASOUND TECHNIQUE: Gray-scale sonography with graded compression, as well as color Doppler and duplex  ultrasound were performed to evaluate the lower extremity deep venous systems from the level of the common femoral vein and including the common femoral, femoral, profunda femoral, popliteal and calf veins including the posterior tibial, peroneal and gastrocnemius veins when visible. The superficial great saphenous vein was also interrogated. Spectral Doppler was utilized to evaluate flow at rest and with distal augmentation maneuvers in the common femoral, femoral and popliteal veins. COMPARISON:  Bilateral lower extremity venous Doppler ultrasound-07/29/2021 (negative, though patient refused imaging of the popliteal and calf veins) FINDINGS: Contralateral Common Femoral Vein: Respiratory phasicity is normal and symmetric with the symptomatic side. No evidence of thrombus. Normal compressibility. Common Femoral Vein: No evidence of thrombus. Normal compressibility, respiratory phasicity and response to augmentation. Saphenofemoral Junction: No evidence of thrombus. Normal compressibility and flow on color Doppler imaging. Profunda Femoral Vein: No evidence of thrombus. Normal compressibility and flow on color Doppler imaging. Femoral Vein: No evidence of thrombus to  the level of the mid aspect the left femoral vein. Patient refused imaging of the distal aspect of the left femoral vein. Popliteal Vein: Patient refused imaging. Calf Veins: Patient refused imaging. Superficial Great Saphenous Vein: Patient refused imaging. Other Findings:  None. IMPRESSION: No evidence of DVT to the level of the mid aspect of the left femoral vein. Patient refused further imaging, similar to the 07/2021 examination. Electronically Signed   By: Sandi Mariscal M.D.   On: 12/12/2021 11:59   CT T-SPINE NO CHARGE  Result Date: 12/11/2021 CLINICAL DATA:  Back trauma lower extremity edema and weakness EXAM: CT THORACIC SPINE WITHOUT CONTRAST TECHNIQUE: Multidetector CT images of the thoracic were obtained using the standard protocol without intravenous contrast. RADIATION DOSE REDUCTION: This exam was performed according to the departmental dose-optimization program which includes automated exposure control, adjustment of the mA and/or kV according to patient size and/or use of iterative reconstruction technique. COMPARISON:  None. FINDINGS: Alignment: Normal. Vertebrae: No acute fracture or focal pathologic process. Paraspinal and other soft tissues: Negative.  Aortic atherosclerosis Disc levels: Extensive multilevel degenerative osteophytes with disc space narrowing and partial ankylosis T6 through T8 and T9 through T10. IMPRESSION: No acute osseous abnormality.  Extensive degenerative changes. Electronically Signed   By: Donavan Foil M.D.   On: 12/11/2021 20:52   CT L-SPINE NO CHARGE  Result Date: 12/11/2021 CLINICAL DATA:  Back trauma EXAM: CT LUMBAR SPINE WITHOUT CONTRAST TECHNIQUE: Multidetector CT imaging of the lumbar spine was performed without intravenous contrast administration. Multiplanar CT image reconstructions were also generated. RADIATION DOSE REDUCTION: This exam was performed according to the departmental dose-optimization program which includes automated exposure control,  adjustment of the mA and/or kV according to patient size and/or use of iterative reconstruction technique. COMPARISON:  MRI 02/04/2019 FINDINGS: Segmentation: Hypoplastic ribs at T12. Last well-formed vertebra will be designated L5. Alignment: Grade 1 anterolisthesis L3 on L4. Vertebrae: No acute fracture or focal pathologic process. Paraspinal and other soft tissues: No paravertebral or paraspinal soft tissue abnormality. Aortic atherosclerosis. Disc levels: At T12-L1, maintained disc space. No canal stenosis. The foramen are patent bilaterally. At L1-L2, disc space narrowing. No canal stenosis. Bilateral facet degenerative changes. Mild bilateral foraminal narrowing. At L2-L3, advanced disc space narrowing. No high-grade canal stenosis. Moderate hypertrophic facet degenerative changes. Moderate left foraminal narrowing. At L3-L4, vacuum disc. Diffuse disc bulge. Moderate canal stenosis secondary to hypertrophic facet degenerative changes and bulky left facet spurring. The foramen appear narrowed bilaterally. At L4-L5 advanced disc space narrowing. No high-grade canal stenosis. Hypertrophic facet degenerative  changes. Moderate right greater than left foraminal narrowing. At L5-S1, disc space narrowing. No canal stenosis. Marked hypertrophic facet degenerative changes. Foramen are patent bilaterally. IMPRESSION: 1. No acute osseous abnormality. 2. Grade 1 anterolisthesis L3 on L4 with multilevel advanced degenerative changes, worst at L3-L4 where there is marked canal stenosis due to listhesis and advanced facet degenerative changes as well as disc disease. Electronically Signed   By: Donavan Foil M.D.   On: 12/11/2021 21:00   ECHOCARDIOGRAM COMPLETE  Result Date: 12/13/2021    ECHOCARDIOGRAM REPORT   Patient Name:   TERILYN SANO Date of Exam: 12/12/2021 Medical Rec #:  [id number]      Height:       60.0 in Accession #:    [id number]     Weight:       214.0 lb Date of Birth:  [birth year]      BSA:          1.921  m Patient Age:    4 years       BP:           144/114 mmHg Patient Gender: F              HR:           88 bpm. Exam Location:  [facility] Procedure: 2D Echo, Color Doppler and Cardiac Doppler Indications:     R06.03 Acute respiratory distress  History:         Patient has no prior history of Echocardiogram examinations.                  CKD stage 3; Risk Factors:Hypertension, Diabetes and                  Dyslipidemia.  Sonographer:     Charmayne Sheer Referring Phys:  [id number] Suann Larry DANFORD Diagnosing Phys: Serafina Royals MD  Sonographer Comments: Suboptimal parasternal window. TDS due to mental confusion of pt and inability to understand nature of test, Image acquisition challenging due to patient body habitus and Image acquisition challenging due to uncooperative patient. IMPRESSIONS  1. Left ventricular ejection fraction, by estimation, is 65 to 70%. The left ventricle has normal function. The left ventricle has no regional wall motion abnormalities. Left ventricular diastolic parameters are consistent with Grade I diastolic dysfunction (impaired relaxation).  2. Right ventricular systolic function is normal. The right ventricular size is normal.  3. The mitral valve is normal in structure. Trivial mitral valve regurgitation.  4. The aortic valve is normal in structure. Aortic valve regurgitation is not visualized. FINDINGS  Left Ventricle: Left ventricular ejection fraction, by estimation, is 65 to 70%. The left ventricle has normal function. The left ventricle has no regional wall motion abnormalities. The left ventricular internal cavity size was normal in size. There is  no left ventricular hypertrophy. Left ventricular diastolic parameters are consistent with Grade I diastolic dysfunction (impaired relaxation). Right Ventricle: The right ventricular size is normal. No increase in right ventricular wall thickness. Right ventricular systolic function is normal. Left Atrium: Left atrial size was normal in size.  Right Atrium: Right atrial size was normal in size. Pericardium: There is no evidence of pericardial effusion. Mitral Valve: The mitral valve is normal in structure. Trivial mitral valve regurgitation. MV peak gradient, 10.8 mmHg. The mean mitral valve gradient is 4.0 mmHg. Tricuspid Valve: The tricuspid valve is normal in structure. Tricuspid valve regurgitation is trivial. Aortic Valve: The aortic valve is normal in structure. Aortic valve regurgitation  is not visualized. Aortic valve mean gradient measures 13.0 mmHg. Aortic valve peak gradient measures 19.5 mmHg. Aortic valve area, by VTI measures 1.54 cm. Pulmonic Valve: The pulmonic valve was normal in structure. Pulmonic valve regurgitation is not visualized. Aorta: The aortic root and ascending aorta are structurally normal, with no evidence of dilitation. IAS/Shunts: No atrial level shunt detected by color flow Doppler.  LEFT VENTRICLE PLAX 2D LVOT diam:     2.10 cm     Diastology LV SV:         60          LV e' lateral:   9.25 cm/s LV SV Index:   31          LV E/e' lateral: 8.0 LVOT Area:     3.46 cm  LV Volumes (MOD) LV vol d, MOD A2C: 47.5 ml LV vol d, MOD A4C: 99.7 ml LV vol s, MOD A2C: 20.5 ml LV vol s, MOD A4C: 30.5 ml LV SV MOD A2C:     27.0 ml LV SV MOD A4C:     99.7 ml LV SV MOD BP:      47.6 ml RIGHT VENTRICLE RV Basal diam:  3.94 cm LEFT ATRIUM             Index        RIGHT ATRIUM           Index LA Vol (A2C):   36.6 ml 19.05 ml/m  RA Area:     14.80 cm LA Vol (A4C):   35.0 ml 18.22 ml/m  RA Volume:   35.60 ml  18.53 ml/m LA Biplane Vol: 36.7 ml 19.10 ml/m  AORTIC VALVE AV Area (Vmax):    1.53 cm AV Area (Vmean):   1.34 cm AV Area (VTI):     1.54 cm AV Vmax:           221.00 cm/s AV Vmean:          176.000 cm/s AV VTI:            0.391 m AV Peak Grad:      19.5 mmHg AV Mean Grad:      13.0 mmHg LVOT Vmax:         97.40 cm/s LVOT Vmean:        68.100 cm/s LVOT VTI:          0.174 m LVOT/AV VTI ratio: 0.45  AORTA Ao Root diam: 2.60 cm  MITRAL VALVE MV Area (PHT): 5.88 cm     SHUNTS MV Area VTI:   1.70 cm     Systemic VTI:  0.17 m MV Peak grad:  10.8 mmHg    Systemic Diam: 2.10 cm MV Mean grad:  4.0 mmHg MV Vmax:       1.64 m/s MV Vmean:      89.0 cm/s MV Decel Time: 129 msec MV E velocity: 74.10 cm/s MV A velocity: 148.00 cm/s MV E/A ratio:  0.50 Serafina Royals MD Electronically signed by Serafina Royals MD Signature Date/Time: 12/13/2021/9:06:13 AM    Final    CT CHEST ABDOMEN PELVIS WO CONTRAST  Result Date: 12/11/2021 CLINICAL DATA:  Generalized weakness EXAM: CT CHEST, ABDOMEN AND PELVIS WITHOUT CONTRAST TECHNIQUE: Multidetector CT imaging of the chest, abdomen and pelvis was performed following the standard protocol without IV contrast. RADIATION DOSE REDUCTION: This exam was performed according to the departmental dose-optimization program which includes automated exposure control, adjustment of the mA and/or kV according to patient size  and/or use of iterative reconstruction technique. COMPARISON:  None. FINDINGS: CT CHEST FINDINGS Cardiovascular: Somewhat limited due to lack of IV contrast. Diffuse atherosclerotic calcifications of the thoracic aorta are noted. No aneurysmal dilatation is seen. No significant Peri calcifications are noted. No cardiac enlargement is seen. Mediastinum/Nodes: Thoracic inlet is within normal limits. No sizable hilar or mediastinal adenopathy is noted. The esophagus as visualized is within normal limits. Lungs/Pleura: Lungs are well aerated bilaterally. Mild dependent atelectatic changes are seen. There is a 4-5 mm nodule within the superior segment of the left lower lobe best seen on image number 50 of series 4. No other sizable parenchymal nodule is noted. No effusion or pneumothorax is seen. Musculoskeletal: Degenerative changes of the thoracic spine are noted. No acute rib abnormality is noted. CT ABDOMEN PELVIS FINDINGS Hepatobiliary: No focal liver abnormality is seen. No gallstones, gallbladder wall  thickening, or biliary dilatation. Gallbladder appears decompressed. Pancreas: Unremarkable. No pancreatic ductal dilatation or surrounding inflammatory changes. Spleen: Normal in size without focal abnormality. Adrenals/Urinary Tract: Adrenal glands are within normal limits bilaterally. No renal calculi or obstructive changes are seen. The ureters are within normal limits. The bladder is well distended. Stomach/Bowel: Scattered diverticular change of the colon is noted without evidence of diverticulitis. The appendix is within normal limits. Small bowel and stomach are unremarkable. Vascular/Lymphatic: Aortic atherosclerosis. No enlarged abdominal or pelvic lymph nodes. Reproductive: Uterus demonstrates diffuse calcifications without focal mass. No adnexal abnormality is seen. Other: No abdominal wall hernia or abnormality. No abdominopelvic ascites. Musculoskeletal: No acute or significant osseous findings. Mild degenerative anterolisthesis of L3 on L4 is noted. IMPRESSION: No acute visceral injury is identified. No acute bony abnormality is noted. 5 mm left solid pulmonary nodule in the left lower lobe as described. No routine follow-up imaging is recommended per Fleischner Society Guidelines. These guidelines do not apply to immunocompromised patients and patients with cancer. Follow up in patients with significant comorbidities as clinically warranted. For lung cancer screening, adhere to Lung-RADS guidelines. Reference: Radiology. 2017; 284(1):228-43. Aortic Atherosclerosis (ICD10-I70.0). Electronically Signed   By: Inez Catalina M.D.   On: 12/11/2021 20:39    Microbiology: Recent Results (from the past 240 hour(s))  Resp Panel by RT-PCR (Flu A&B, Covid) Nasopharyngeal Swab     Status: None   Collection Time: 12/11/21 10:09 PM   Specimen: Nasopharyngeal Swab; Nasopharyngeal(NP) swabs in vial transport medium  Result Value Ref Range Status   SARS Coronavirus 2 by RT PCR NEGATIVE NEGATIVE Final     Comment: (NOTE) SARS-CoV-2 target nucleic acids are NOT DETECTED.  The SARS-CoV-2 RNA is generally detectable in upper respiratory specimens during the acute phase of infection. The lowest concentration of SARS-CoV-2 viral copies this assay can detect is 138 copies/mL. A negative result does not preclude SARS-Cov-2 infection and should not be used as the sole basis for treatment or other patient management decisions. A negative result may occur with  improper specimen collection/handling, submission of specimen other than nasopharyngeal swab, presence of viral mutation(s) within the areas targeted by this assay, and inadequate number of viral copies(<138 copies/mL). A negative result must be combined with clinical observations, patient history, and epidemiological information. The expected result is Negative.  Fact Sheet for Patients:  EntrepreneurPulse.com.au  Fact Sheet for Healthcare Providers:  IncredibleEmployment.be  This test is no t yet approved or cleared by the [place] FDA and  has been authorized for detection and/or diagnosis of SARS-CoV-2 by FDA under an Emergency Use Authorization (EUA). This EUA will remain  in effect (meaning this test can be used) for the duration of the COVID-19 declaration under Section 564(b)(1) of the Act, 21 U.S.C.section 360bbb-3(b)(1), unless the authorization is terminated  or revoked sooner.       Influenza A by PCR NEGATIVE NEGATIVE Final   Influenza B by PCR NEGATIVE NEGATIVE Final    Comment: (NOTE) The Xpert Xpress SARS-CoV-2/FLU/RSV plus assay is intended as an aid in the diagnosis of influenza from Nasopharyngeal swab specimens and should not be used as a sole basis for treatment. Nasal washings and aspirates are unacceptable for Xpert Xpress SARS-CoV-2/FLU/RSV testing.  Fact Sheet for Patients: EntrepreneurPulse.com.au  Fact Sheet for Healthcare  Providers: IncredibleEmployment.be  This test is not yet approved or cleared by the [place] FDA and has been authorized for detection and/or diagnosis of SARS-CoV-2 by FDA under an Emergency Use Authorization (EUA). This EUA will remain in effect (meaning this test can be used) for the duration of the COVID-19 declaration under Section 564(b)(1) of the Act, 21 U.S.C. section 360bbb-3(b)(1), unless the authorization is terminated or revoked.  Performed at [facility], [address] [place], [place] [address]      Labs: CBC: Recent Labs  Lab 12/11/21 1357 12/11/21 2328 12/13/21 0914 12/14/21 0508  WBC 4.1 3.9* 5.2 5.8  HGB 11.4* 11.1* 10.2* 9.8*  HCT 35.6* 34.5* 31.7* 30.9*  MCV 86.4 85.2 86.1 87.5  PLT 110* 113* 119* 897*   Basic Metabolic Panel: Recent Labs  Lab 12/11/21 1357 12/11/21 2328 12/12/21 0418 12/13/21 0914 12/14/21 0508  NA 135  --  138 139 141  K 4.4  --  3.4* 3.1* 4.1  CL 100  --  102 103 108  CO2 25  --  '26 28 28  ' GLUCOSE 490*  --  201* 127* 115*  BUN 28*  --  '19 19 22  ' CREATININE 1.49*  --  1.12* 1.21* 1.30*  CALCIUM 8.8*  --  8.6* 8.3* 8.3*  MG  --  1.7  --  1.6* 1.8  PHOS  --  2.6  --  2.9 2.7   Liver Function Tests: No results for input(s): AST, ALT, ALKPHOS, BILITOT, PROT, ALBUMIN in the last 168 hours. No results for input(s): LIPASE, AMYLASE in the last 168 hours. No results for input(s): AMMONIA in the last 168 hours. Cardiac Enzymes: Recent Labs  Lab 12/12/21 0418  CKTOTAL 165   BNP (last 3 results) Recent Labs    07/28/21 2133 12/12/21 0418  BNP 31.8 79.6   CBG: Recent Labs  Lab 12/13/21 1151 12/13/21 1639 12/13/21 2140 12/14/21 0722 12/14/21 1150  GLUCAP 270* 166* 109* 122* 238*    Time spent: 35 minutes  Signed:  Val Riles  [facility]  12/14/2021 1:40 PM

## 2021-12-14 NOTE — TOC Progression Note (Addendum)
Transition of Care [facility]) - Progression Note    Patient Details  Name: Olivia Chambers MRN: [id number] Date of Birth: 1928-02-25  Transition of [facility]) CM/SW Contact  Caryn Section, RN Phone Number: 12/14/2021, 10:40 AM  Clinical Narrative:   MD spoke with patient, patient will go home with hospice today.  [facility] aware, [facility] aware that patient will need hospital bed, ordered via [facility].  Patient will discharge today with [facility] hospice.  Bed order with [facility] cancelled, [facility] ordered equipment  Expected Discharge Plan: Home w Home Health Services Barriers to Discharge: Continued Medical Work up  Expected Discharge Plan and Services Expected Discharge Plan: Home w Home Health Services   Discharge Planning Services: CM Consult   Living arrangements for the past 2 months: Single Family Home                                       Social Determinants of Health (SDOH) Interventions    Readmission Risk Interventions No flowsheet data found.

## 2021-12-14 NOTE — Progress Notes (Addendum)
[facility] [employer] [facility]) Hospital Liaison Note  Received request from Transitions of Care Manager Amado Nash, RN, for hospice services at home after discharge. Chart and patient information under review by [facility] physician. Hospice eligibility confirmed.  Spoke with daughter Jamesetta So to initiate education related to hospice philosophy, services and team approach to care. Patient/family verbalized understanding of information provided. Per discussion, the plan for discharge is via EMS once DME is delivered.  DME needs discussed. Patient/family requests the following equipment for delivery: hospital bed, OBT, W/C, BSC. Address has been verified and is correct in the chart. Jamesetta So is the family contact to arrange time of equipment delivery.   Please send signed and completed DNR home with patient/family. Please provide prescriptions at discharge as needed to ensure ongoing symptom management.   [facility] information and contact numbers given to family. Above information shared with [facility] Manager.   Please do not hesitate to call with any hospice related questions or concerns.   Thank you for the opportunity to participate in this patient's care.   Celene Squibb, RN, BSN [facility] Liaison [phone]

## 2022-01-24 DEATH — deceased

## 2022-06-25 IMAGING — CT CT HEAD W/O CM
4 series · 16 of 47 positions shown, 18 images · non-contrast
Comparison: November 08, 2020

CLINICAL DATA: Bilateral lower extremity edema and weakness.



[Series 2: head wo · axial · 0.47mm/px · z∈[-88,+37]mm · 7 of 35 slices shown, 9 images]
[im 5/35  brain]
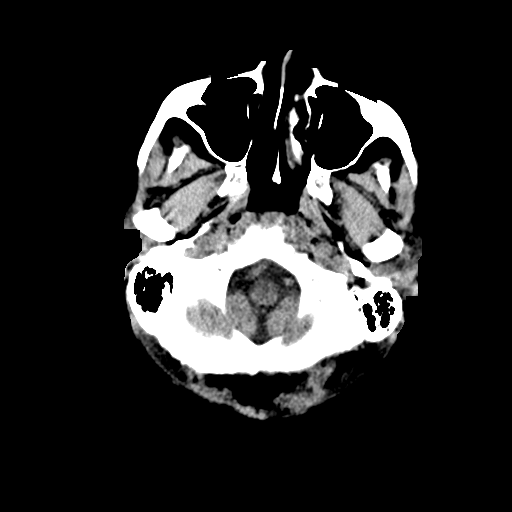
[im 5/35  bone]
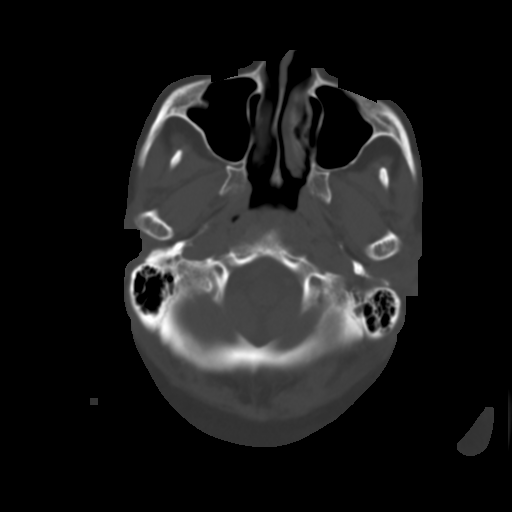
[im 9/35  brain]
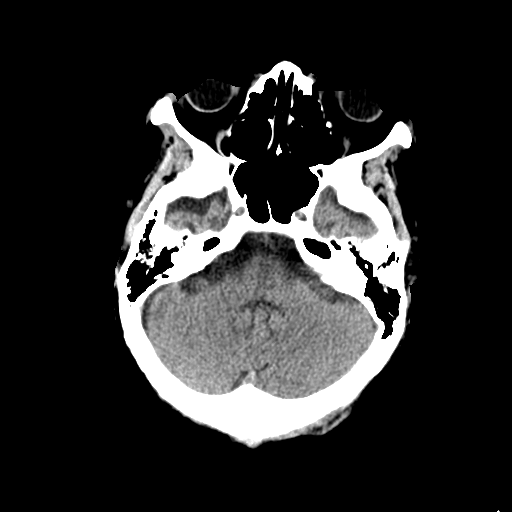
[im 13/35  brain]
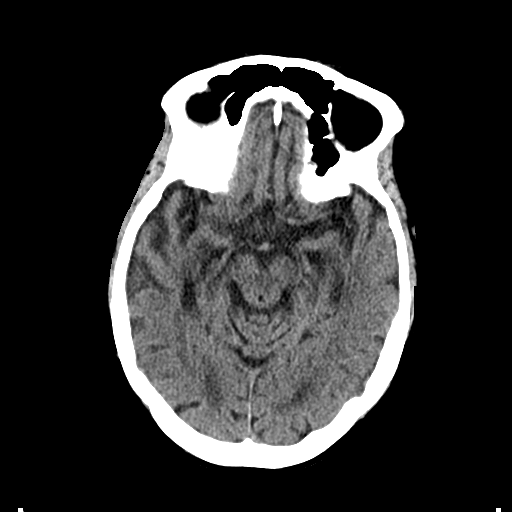
[im 18/35  brain]
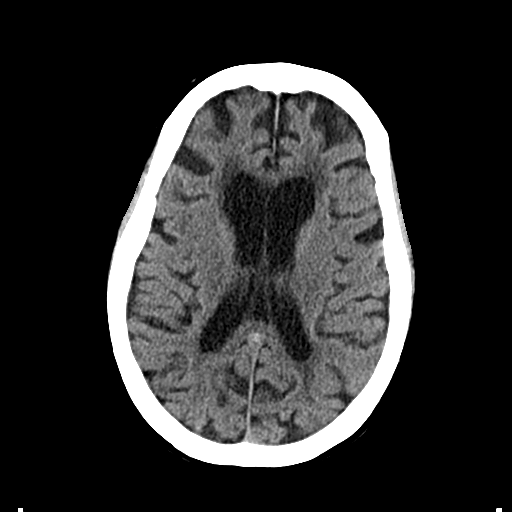
[im 22/35  brain]
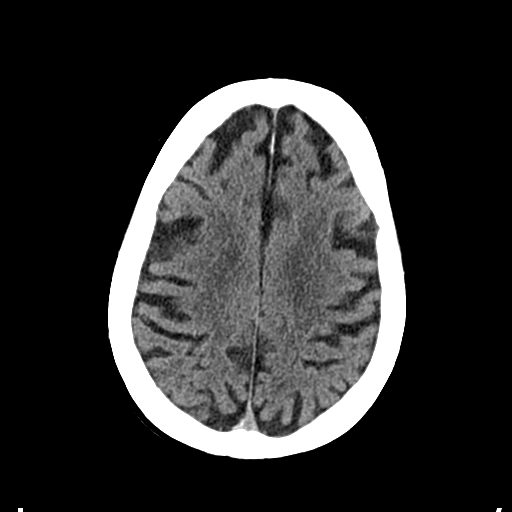
[im 22/35  bone]
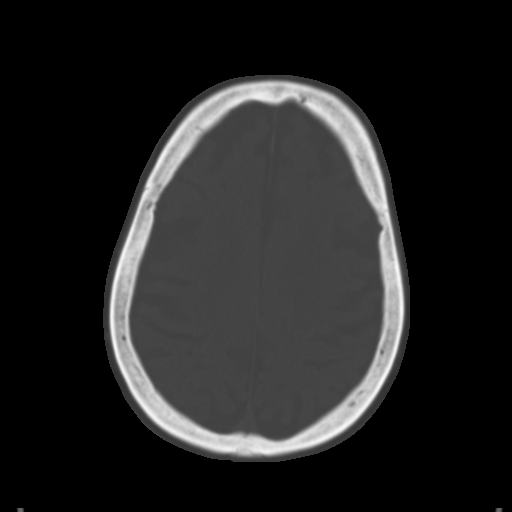
[im 26/35  brain]
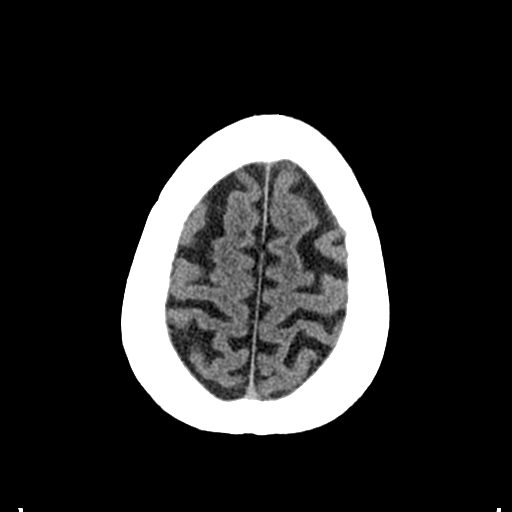
[im 30/35  brain]
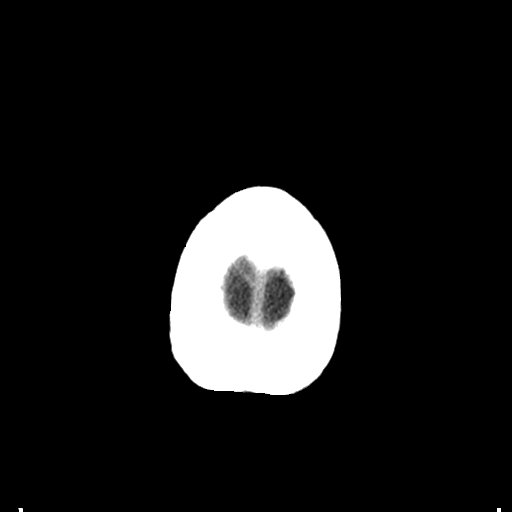

[Series 3: head bone · axial · 0.47mm/px · z∈[-92,-60]mm · 3 of 84 slices shown]
[im 9/84  bone]
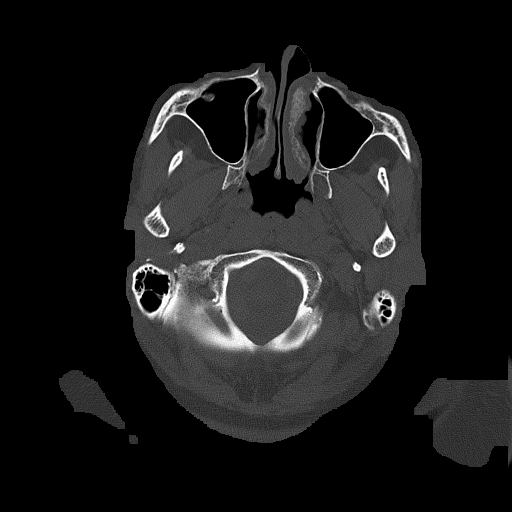
[im 17/84  bone]
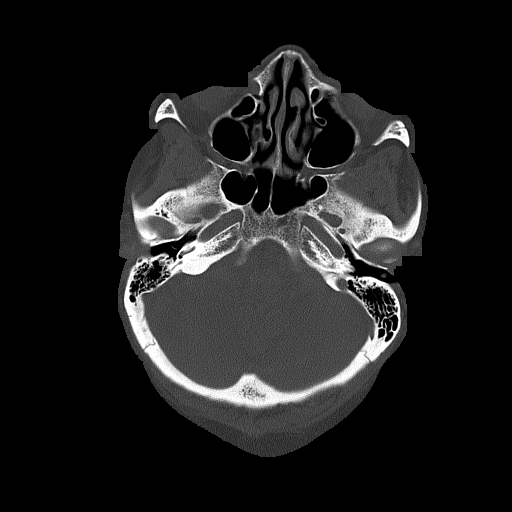
[im 25/84  bone]
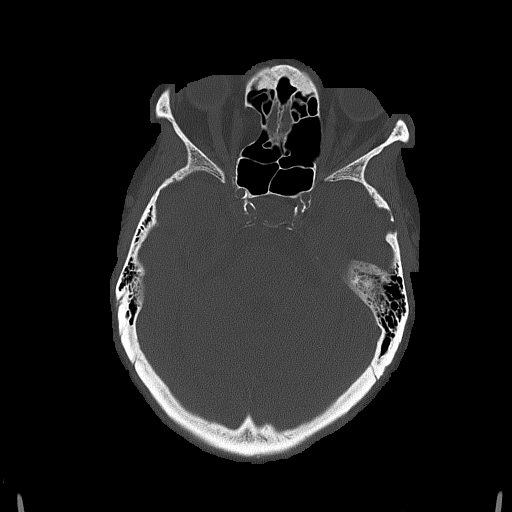

[Series 4: coronal soft tissue · coronal · 0.35mm/px · 3 of 73 slices shown]
[im 25/73  brain]
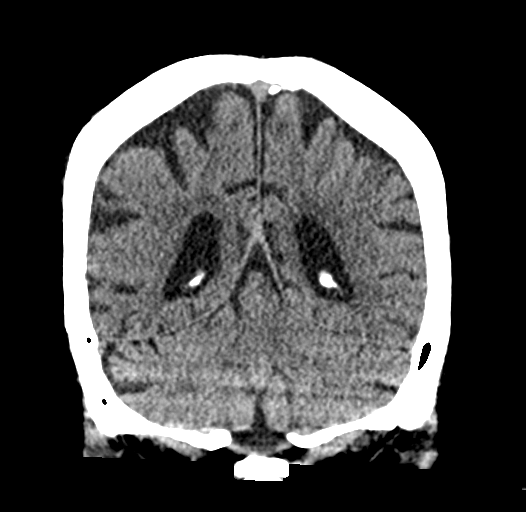
[im 33/73  brain]
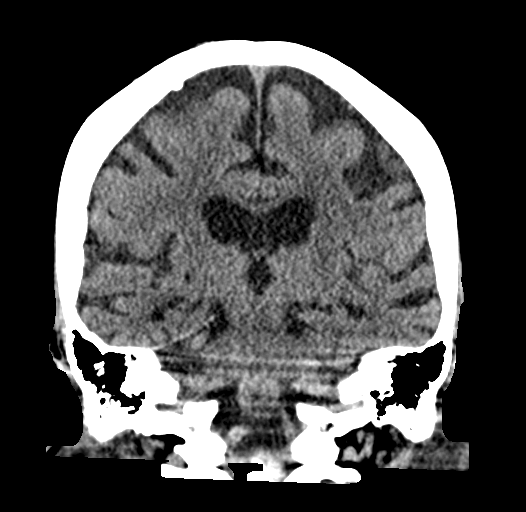
[im 41/73  brain]
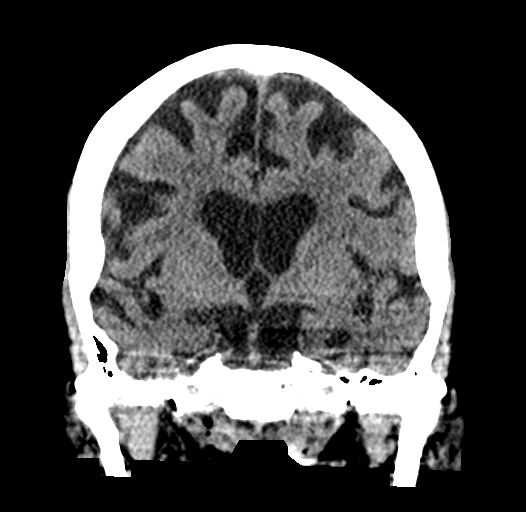

[Series 5: sagittal soft tissue · sagittal · 0.35mm/px · 3 of 60 slices shown]
[im 20/60  brain]
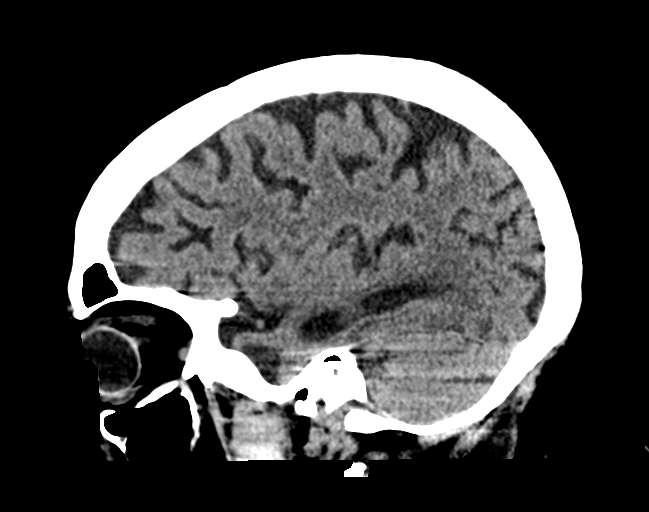
[im 30/60  brain]
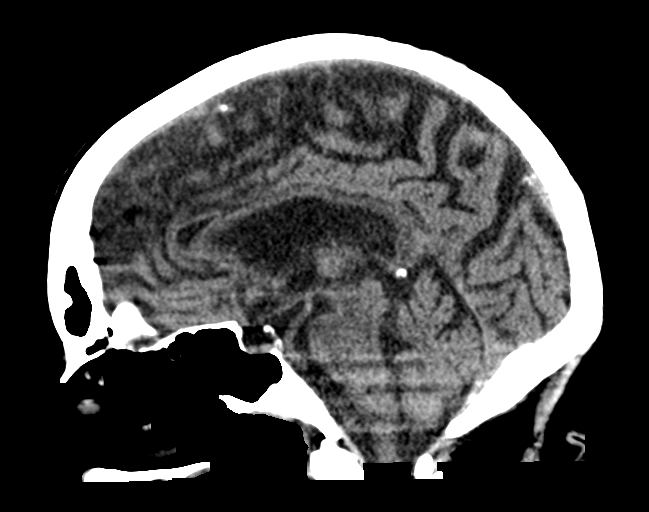
[im 40/60  brain]
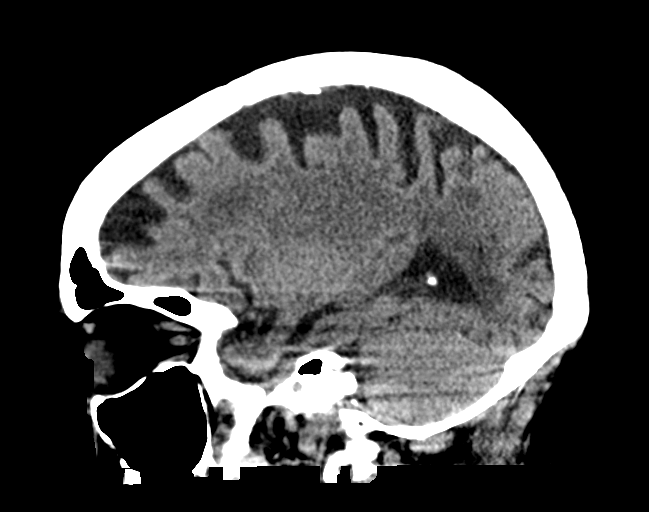

[16 of 47 positions shown; findings below may reference images not displayed]

FINDINGS: Brain: There is mild cerebral atrophy with widening of the
extra-axial spaces and ventricular dilatation.
There are areas of decreased attenuation within the white matter
tracts of the supratentorial brain, consistent with microvascular
disease changes.

A small amount of acute subarachnoid blood is seen within the right
occipital lobe (axial CT image 16 and 17, CT series 2). There is no
evidence of associated mass effect or midline shift.

Vascular: No hyperdense vessel or unexpected calcification.

Skull: Normal. Negative for fracture or focal lesion.

Sinuses/Orbits: A chronic deformity is seen along the medial wall of
the right orbit.

Other: None.
IMPRESSION: Small amount of acute subarachnoid blood within the right occipital
lobe.

## 2022-06-25 IMAGING — CT CT CHEST-ABD-PELV W/O CM
2 of 4 series · 13 of 36 positions shown, 15 images · non-contrast
Comparison: None.

CLINICAL DATA: Generalized weakness



[Series 2: cap wo st · axial · 0.98mm/px · z∈[-1052,-567]mm · 10 of 119 slices shown, 12 images]
[im 11/119  mediastinal]
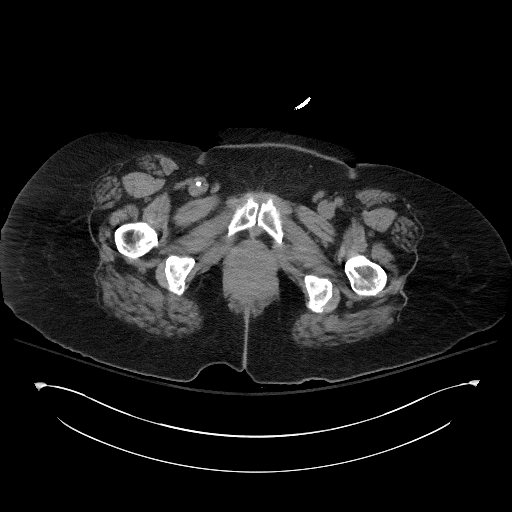
[im 11/119  bone]
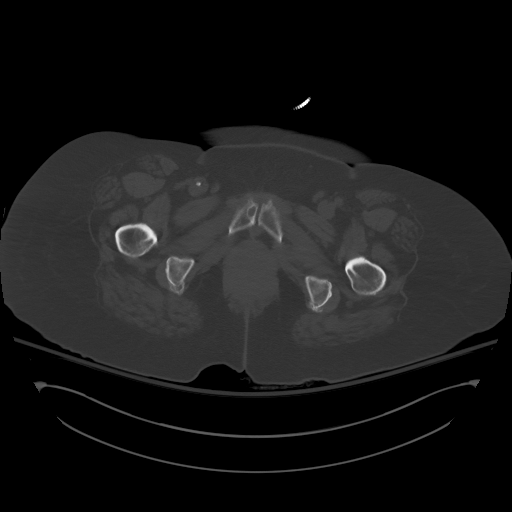
[im 22/119  mediastinal]
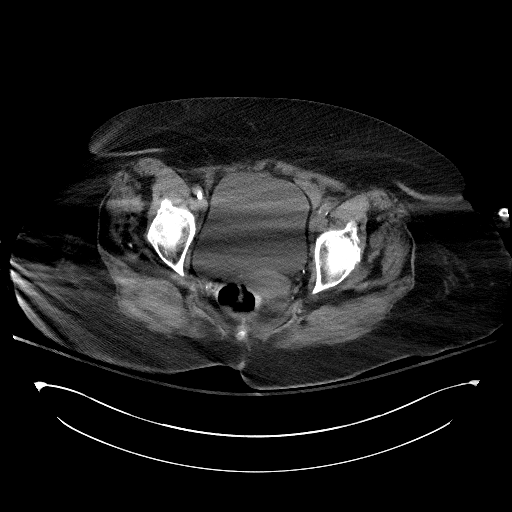
[im 33/119  mediastinal]
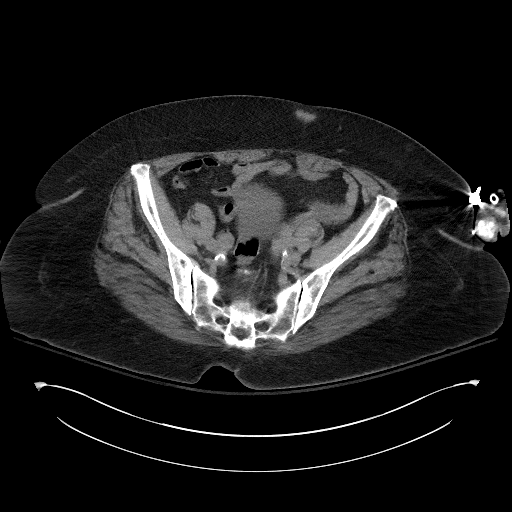
[im 43/119  mediastinal]
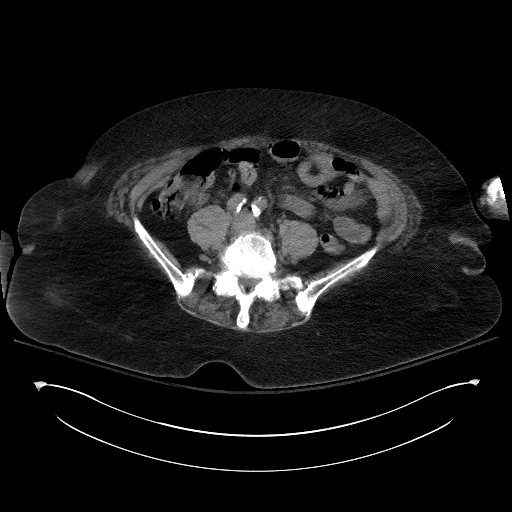
[im 54/119  mediastinal]
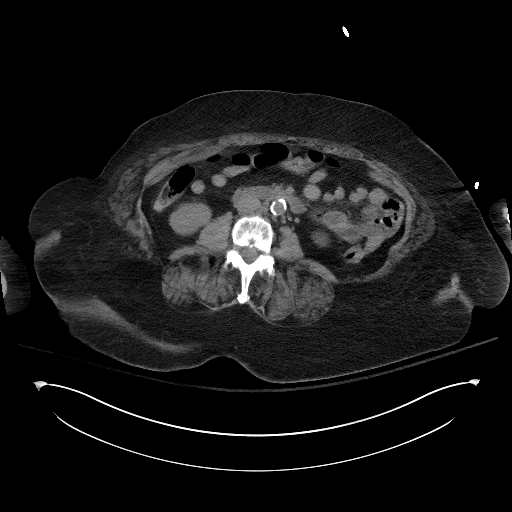
[im 65/119  mediastinal]
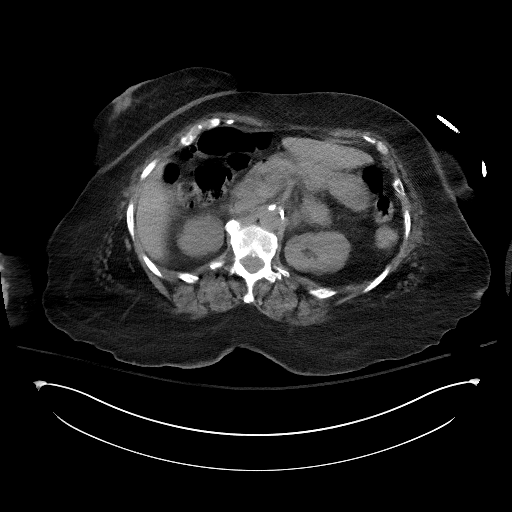
[im 76/119  mediastinal]
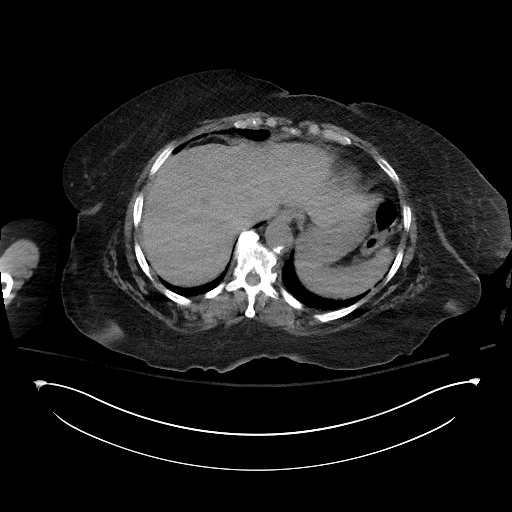
[im 86/119  mediastinal]
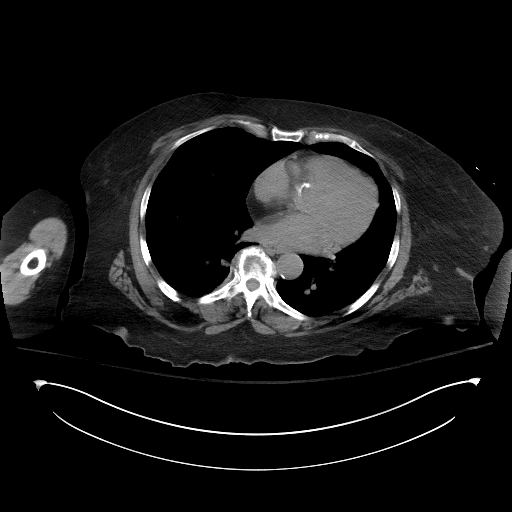
[im 97/119  mediastinal]
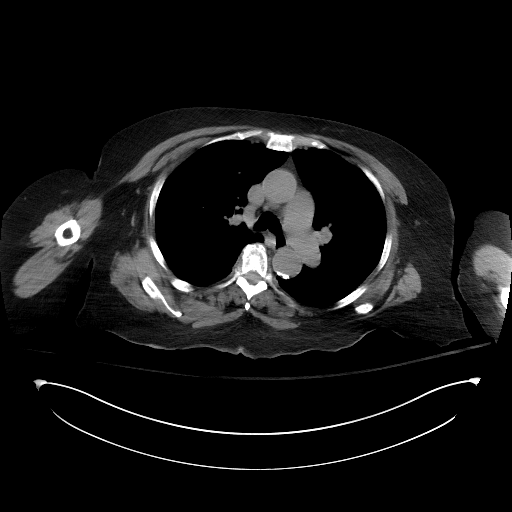
[im 97/119  bone]
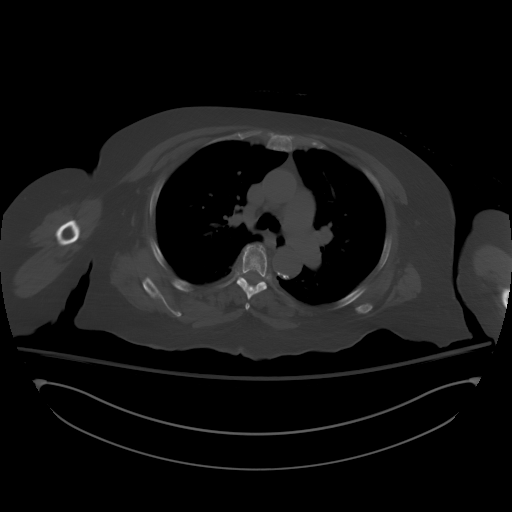
[im 108/119  mediastinal]
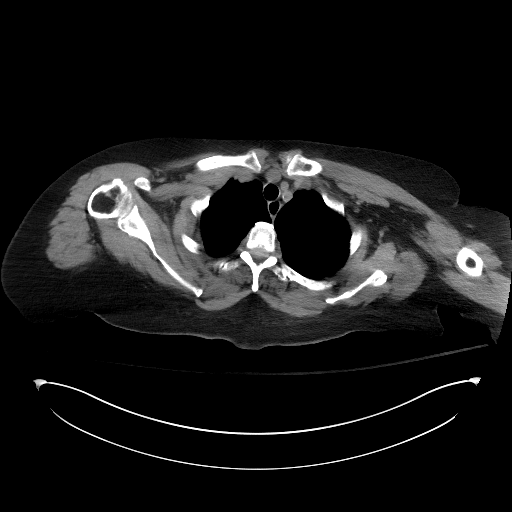

[Series 5: coronal · coronal · 0.94mm/px · 3 of 160 slices shown]
[im 32/160  mediastinal]
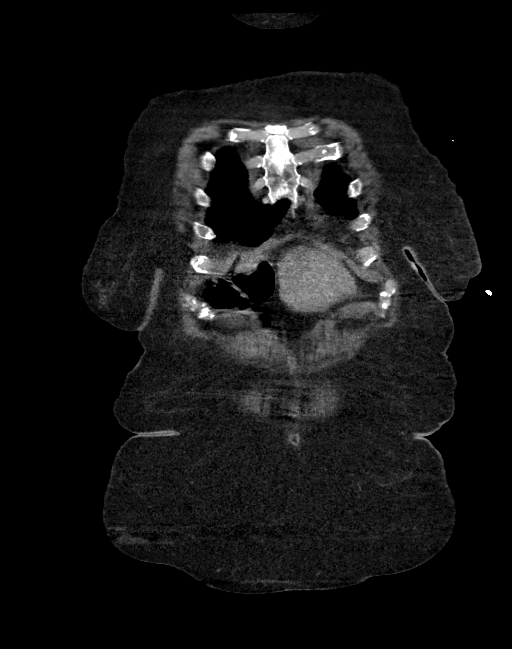
[im 64/160  mediastinal]
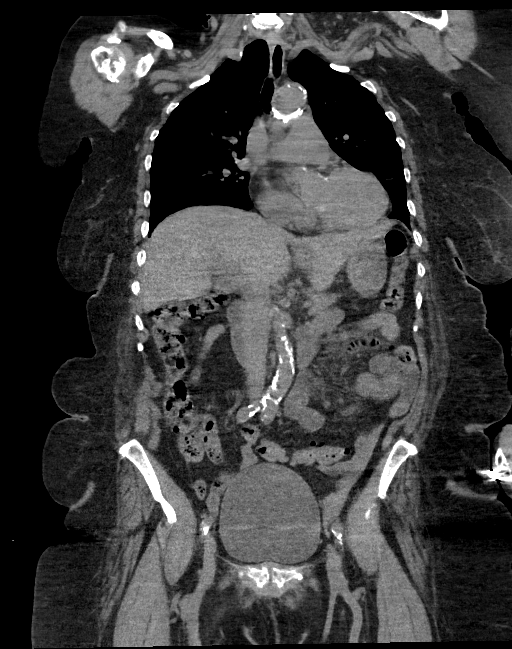
[im 96/160  mediastinal]
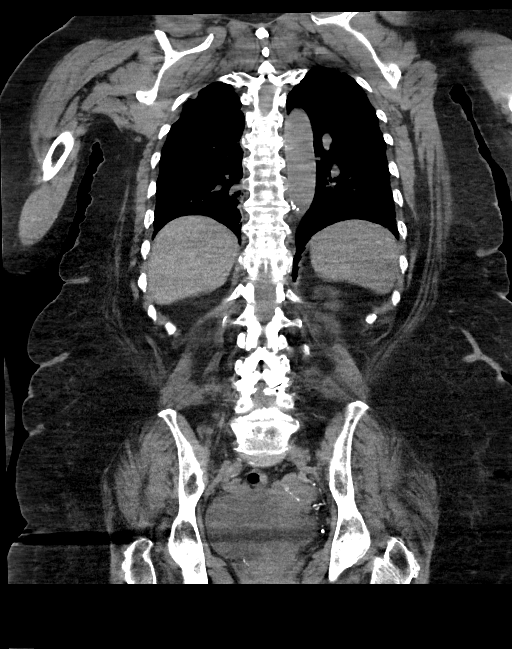

[13 of 36 positions shown; findings below may reference images not displayed]

FINDINGS: CT CHEST FINDINGS

Cardiovascular: Somewhat limited due to lack of IV contrast. Diffuse
atherosclerotic calcifications of the thoracic aorta are noted. No
aneurysmal dilatation is seen. No significant Peri calcifications
are noted. No cardiac enlargement is seen.

Mediastinum/Nodes: Thoracic inlet is within normal limits. No
sizable hilar or mediastinal adenopathy is noted. The esophagus as
visualized is within normal limits.

Lungs/Pleura: Lungs are well aerated bilaterally. Mild dependent
atelectatic changes are seen. There is a 4-5 mm nodule within the
superior segment of the left lower lobe best seen on image number 50
of series 4. No other sizable parenchymal nodule is noted. No
effusion or pneumothorax is seen.

Musculoskeletal: Degenerative changes of the thoracic spine are
noted. No acute rib abnormality is noted.

CT ABDOMEN PELVIS FINDINGS

Hepatobiliary: No focal liver abnormality is seen. No gallstones,
gallbladder wall thickening, or biliary dilatation. Gallbladder
appears decompressed.

Pancreas: Unremarkable. No pancreatic ductal dilatation or
surrounding inflammatory changes.

Spleen: Normal in size without focal abnormality.

Adrenals/Urinary Tract: Adrenal glands are within normal limits
bilaterally. No renal calculi or obstructive changes are seen. The
ureters are within normal limits. The bladder is well distended.

Stomach/Bowel: Scattered diverticular change of the colon is noted
without evidence of diverticulitis. The appendix is within normal
limits. Small bowel and stomach are unremarkable.

Vascular/Lymphatic: Aortic atherosclerosis. No enlarged abdominal or
pelvic lymph nodes.

Reproductive: Uterus demonstrates diffuse calcifications without
focal mass. No adnexal abnormality is seen.

Other: No abdominal wall hernia or abnormality. No abdominopelvic
ascites.

Musculoskeletal: No acute or significant osseous findings. Mild
degenerative anterolisthesis of L3 on L4 is noted.
IMPRESSION: No acute visceral injury is identified. No acute bony abnormality is
noted.

5 mm left solid pulmonary nodule in the left lower lobe as
described. No routine follow-up imaging is recommended per
[HOSPITAL] Guidelines.

These guidelines do not apply to immunocompromised patients and
patients with cancer. Follow up in patients with significant
comorbidities as clinically warranted. For lung cancer screening,
adhere to Lung-RADS guidelines. Reference: Radiology. 8525;

Aortic Atherosclerosis ([id number]-2H3.3).

## 2022-06-26 IMAGING — US US EXTREM LOW VENOUS
1 series · 5 of 5 positions shown · non-contrast
Comparison: Bilateral lower extremity venous Doppler
ultrasound-07/29/2021 (negative, though patient refused imaging of
the popliteal and calf veins)

CLINICAL DATA: Left thigh pain and edema.  Evaluate for DVT.



[Series 1: us venous img lower bilat (dvt) · portal-venous · 5 of 5 slices shown]
[im 1/5]
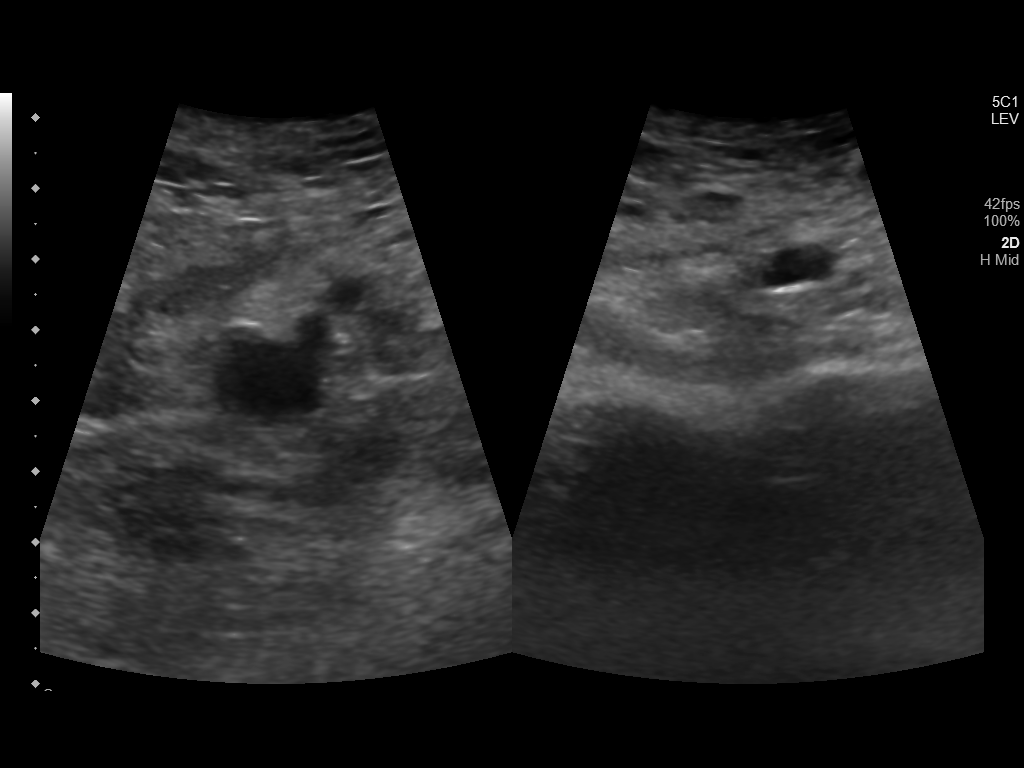
[im 2/5]
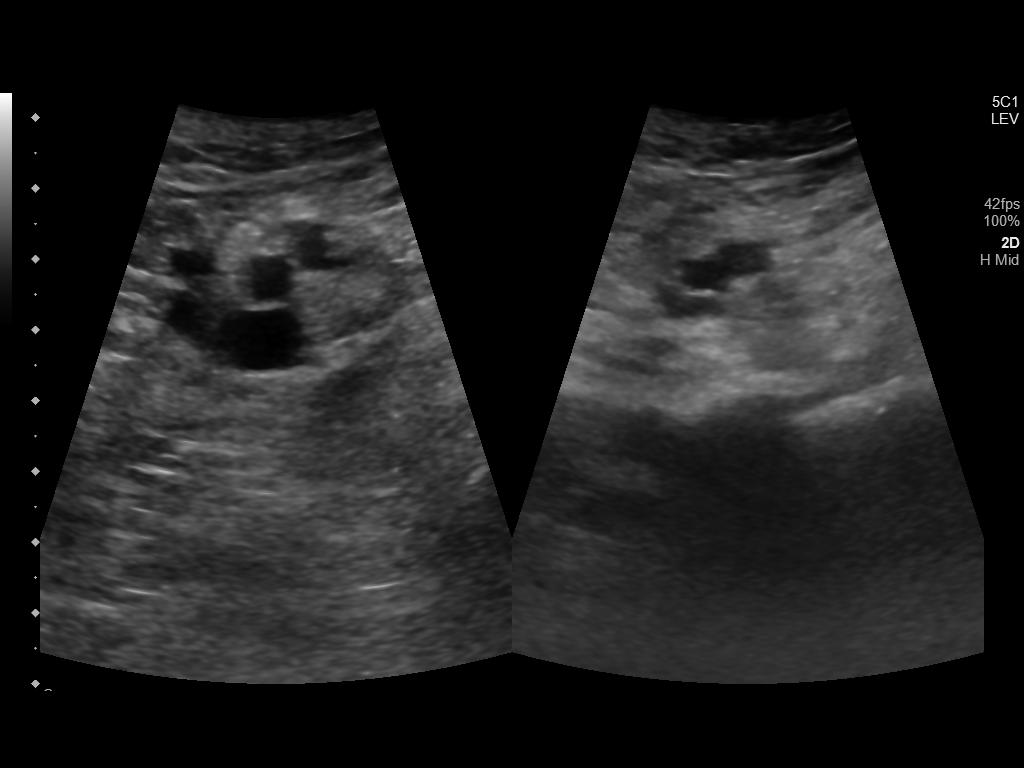
[im 3/5]
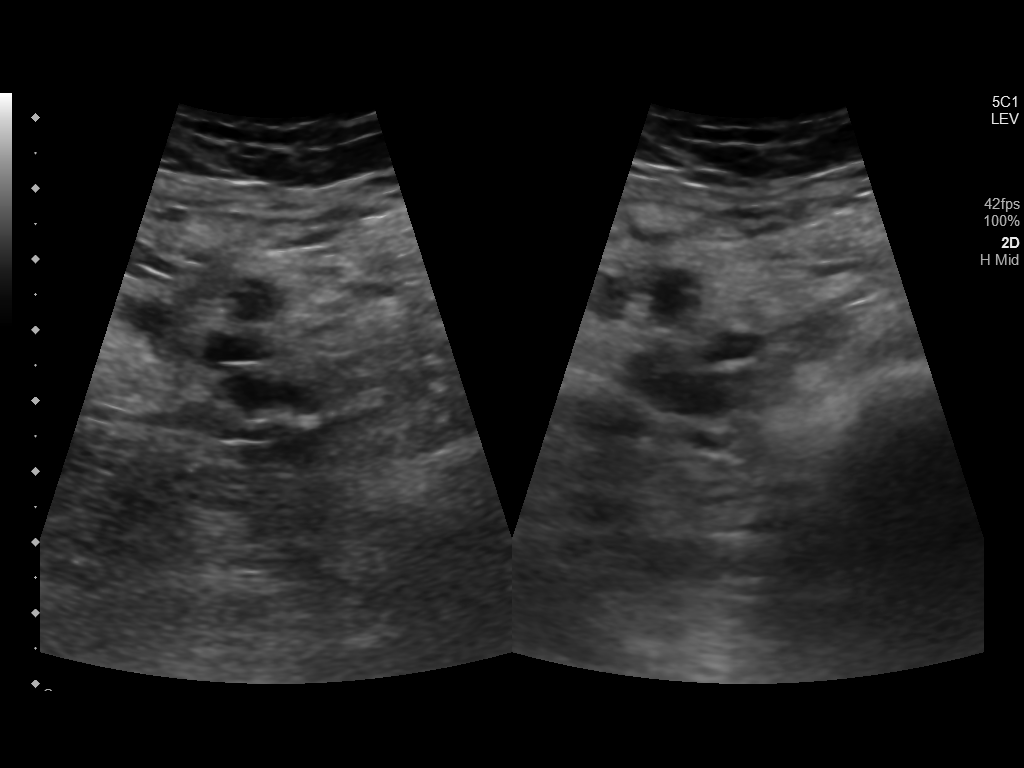
[im 4/5]
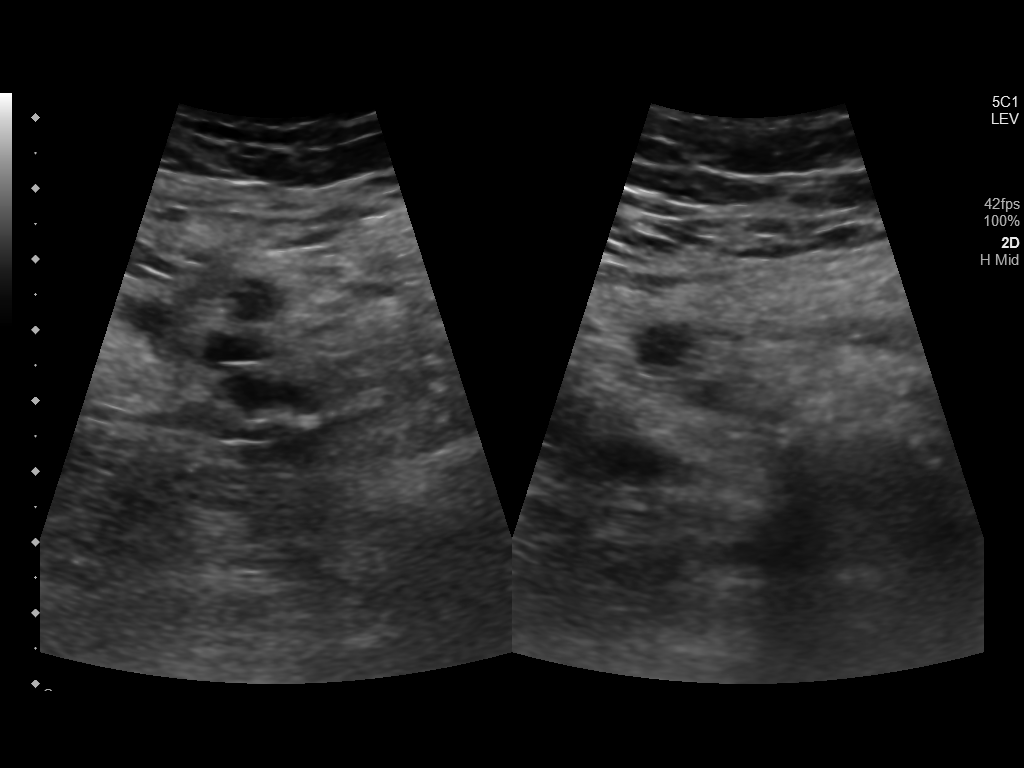
[im 5/5]
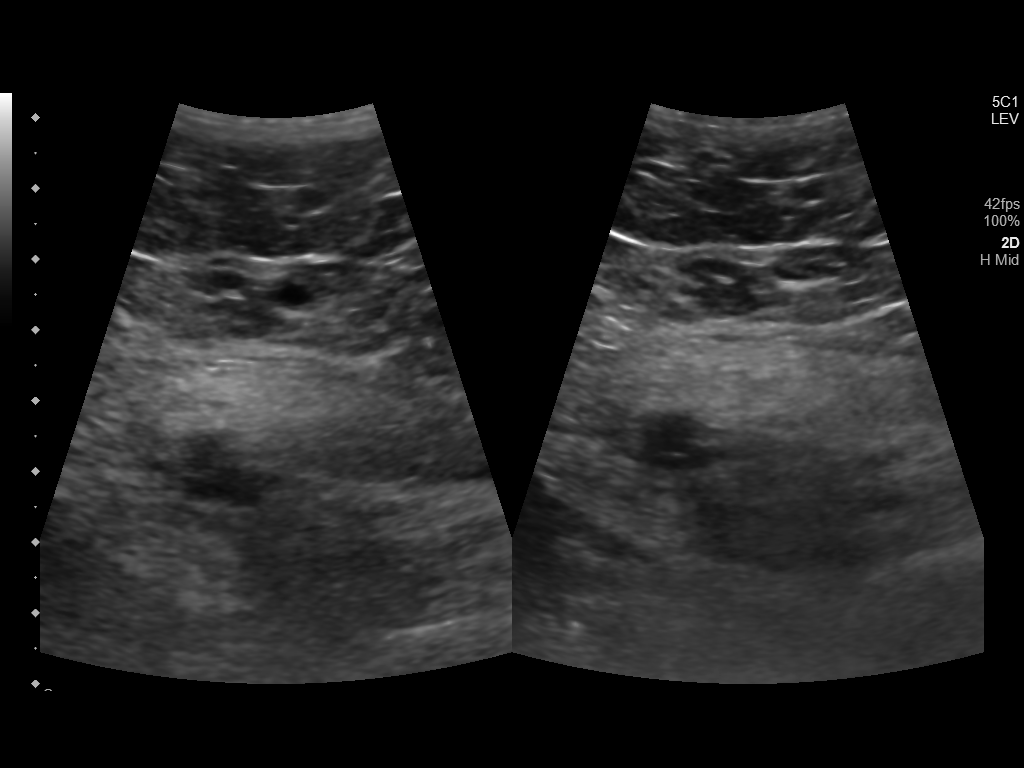

[5 of 5 positions shown; findings below may reference images not displayed]

FINDINGS: Contralateral Common Femoral Vein: Respiratory phasicity is normal
and symmetric with the symptomatic side. No evidence of thrombus.
Normal compressibility.

Common Femoral Vein: No evidence of thrombus. Normal
compressibility, respiratory phasicity and response to augmentation.

Saphenofemoral Junction: No evidence of thrombus. Normal
compressibility and flow on color Doppler imaging.

Profunda Femoral Vein: No evidence of thrombus. Normal
compressibility and flow on color Doppler imaging.

Femoral Vein: No evidence of thrombus to the level of the mid aspect
the left femoral vein. Patient refused imaging of the distal aspect
of the left femoral vein.

Popliteal Vein: Patient refused imaging.

Calf Veins: Patient refused imaging.

Superficial Great Saphenous Vein: Patient refused imaging.

Other Findings:  None.
IMPRESSION: No evidence of DVT to the level of the mid aspect of the left
femoral vein.

Patient refused further imaging, similar to the [DATE] examination.
